# Patient Record
Sex: Female | Born: 1940 | Race: Black or African American | Hispanic: No | Smoking: Never smoker
Health system: Southern US, Community
[De-identification: ages and names within clinical notes are randomized; demographics above are authoritative.]

## PROBLEM LIST (undated history)

## (undated) DIAGNOSIS — M1990 Unspecified osteoarthritis, unspecified site: Secondary | ICD-10-CM

## (undated) DIAGNOSIS — R000 Tachycardia, unspecified: Secondary | ICD-10-CM

## (undated) DIAGNOSIS — F250 Schizoaffective disorder, bipolar type: Secondary | ICD-10-CM

## (undated) DIAGNOSIS — K625 Hemorrhage of anus and rectum: Secondary | ICD-10-CM

## (undated) DIAGNOSIS — I10 Essential (primary) hypertension: Secondary | ICD-10-CM

## (undated) DIAGNOSIS — Z789 Other specified health status: Secondary | ICD-10-CM

## (undated) DIAGNOSIS — D649 Anemia, unspecified: Secondary | ICD-10-CM

## (undated) DIAGNOSIS — R0602 Shortness of breath: Secondary | ICD-10-CM

## (undated) DIAGNOSIS — F259 Schizoaffective disorder, unspecified: Secondary | ICD-10-CM

## (undated) DIAGNOSIS — M4802 Spinal stenosis, cervical region: Secondary | ICD-10-CM

---

## 1998-02-12 ENCOUNTER — Other Ambulatory Visit: Admission: RE | Admit: 1998-02-12 | Discharge: 1998-02-12 | Payer: Self-pay | Admitting: Family Medicine

## 1999-10-05 ENCOUNTER — Emergency Department (HOSPITAL_COMMUNITY): Admission: EM | Admit: 1999-10-05 | Discharge: 1999-10-05 | Payer: Self-pay | Admitting: *Deleted

## 2000-05-31 ENCOUNTER — Other Ambulatory Visit: Admission: RE | Admit: 2000-05-31 | Discharge: 2000-05-31 | Payer: Self-pay | Admitting: Family Medicine

## 2001-01-26 ENCOUNTER — Ambulatory Visit (HOSPITAL_COMMUNITY): Admission: RE | Admit: 2001-01-26 | Discharge: 2001-01-26 | Payer: Self-pay | Admitting: Family Medicine

## 2001-01-26 ENCOUNTER — Encounter: Payer: Self-pay | Admitting: Family Medicine

## 2001-08-23 ENCOUNTER — Other Ambulatory Visit: Admission: RE | Admit: 2001-08-23 | Discharge: 2001-08-23 | Payer: Self-pay | Admitting: Family Medicine

## 2003-01-26 ENCOUNTER — Other Ambulatory Visit: Admission: RE | Admit: 2003-01-26 | Discharge: 2003-01-26 | Payer: Self-pay | Admitting: Anesthesiology

## 2003-01-26 ENCOUNTER — Other Ambulatory Visit: Admission: RE | Admit: 2003-01-26 | Discharge: 2003-01-26 | Payer: Self-pay | Admitting: Family Medicine

## 2003-05-28 ENCOUNTER — Emergency Department (HOSPITAL_COMMUNITY): Admission: AD | Admit: 2003-05-28 | Discharge: 2003-05-28 | Payer: Self-pay | Admitting: Family Medicine

## 2005-02-11 ENCOUNTER — Ambulatory Visit: Payer: Self-pay | Admitting: Psychiatry

## 2005-02-11 ENCOUNTER — Encounter: Payer: Self-pay | Admitting: Emergency Medicine

## 2005-02-11 ENCOUNTER — Inpatient Hospital Stay (HOSPITAL_COMMUNITY): Admission: AD | Admit: 2005-02-11 | Discharge: 2005-02-19 | Payer: Self-pay | Admitting: Psychiatry

## 2005-12-05 ENCOUNTER — Emergency Department (HOSPITAL_COMMUNITY): Admission: EM | Admit: 2005-12-05 | Discharge: 2005-12-06 | Payer: Self-pay | Admitting: Emergency Medicine

## 2006-04-11 ENCOUNTER — Emergency Department (HOSPITAL_COMMUNITY): Admission: EM | Admit: 2006-04-11 | Discharge: 2006-04-11 | Payer: Self-pay | Admitting: Emergency Medicine

## 2006-05-20 ENCOUNTER — Ambulatory Visit: Payer: Self-pay | Admitting: Obstetrics and Gynecology

## 2006-05-25 ENCOUNTER — Ambulatory Visit (HOSPITAL_COMMUNITY): Admission: RE | Admit: 2006-05-25 | Discharge: 2006-05-25 | Payer: Self-pay | Admitting: Obstetrics & Gynecology

## 2006-06-02 ENCOUNTER — Ambulatory Visit: Payer: Self-pay | Admitting: Obstetrics and Gynecology

## 2006-07-19 ENCOUNTER — Emergency Department (HOSPITAL_COMMUNITY): Admission: EM | Admit: 2006-07-19 | Discharge: 2006-07-19 | Payer: Self-pay | Admitting: Emergency Medicine

## 2007-01-27 ENCOUNTER — Emergency Department (HOSPITAL_COMMUNITY): Admission: EM | Admit: 2007-01-27 | Discharge: 2007-01-27 | Payer: Self-pay | Admitting: Emergency Medicine

## 2007-11-17 ENCOUNTER — Emergency Department (HOSPITAL_COMMUNITY): Admission: EM | Admit: 2007-11-17 | Discharge: 2007-11-17 | Payer: Self-pay | Admitting: Emergency Medicine

## 2008-05-15 ENCOUNTER — Encounter: Payer: Self-pay | Admitting: Cardiology

## 2008-05-30 ENCOUNTER — Emergency Department (HOSPITAL_COMMUNITY): Admission: EM | Admit: 2008-05-30 | Discharge: 2008-05-30 | Payer: Self-pay | Admitting: Emergency Medicine

## 2008-10-19 ENCOUNTER — Encounter: Payer: Self-pay | Admitting: Cardiology

## 2008-11-02 DIAGNOSIS — R519 Headache, unspecified: Secondary | ICD-10-CM | POA: Insufficient documentation

## 2008-11-02 DIAGNOSIS — R0602 Shortness of breath: Secondary | ICD-10-CM | POA: Insufficient documentation

## 2008-11-02 DIAGNOSIS — F209 Schizophrenia, unspecified: Secondary | ICD-10-CM

## 2008-11-02 DIAGNOSIS — R51 Headache: Secondary | ICD-10-CM

## 2008-11-02 DIAGNOSIS — R42 Dizziness and giddiness: Secondary | ICD-10-CM | POA: Insufficient documentation

## 2008-11-06 ENCOUNTER — Ambulatory Visit: Payer: Self-pay | Admitting: Cardiology

## 2008-11-06 ENCOUNTER — Observation Stay (HOSPITAL_COMMUNITY): Admission: EM | Admit: 2008-11-06 | Discharge: 2008-11-07 | Payer: Self-pay | Admitting: Emergency Medicine

## 2008-11-06 DIAGNOSIS — R000 Tachycardia, unspecified: Secondary | ICD-10-CM | POA: Insufficient documentation

## 2008-11-07 ENCOUNTER — Encounter: Payer: Self-pay | Admitting: Cardiology

## 2009-01-30 ENCOUNTER — Encounter (INDEPENDENT_AMBULATORY_CARE_PROVIDER_SITE_OTHER): Payer: Self-pay | Admitting: *Deleted

## 2009-01-31 ENCOUNTER — Emergency Department (HOSPITAL_COMMUNITY): Admission: EM | Admit: 2009-01-31 | Discharge: 2009-01-31 | Payer: Self-pay | Admitting: Emergency Medicine

## 2009-03-14 ENCOUNTER — Ambulatory Visit: Payer: Self-pay | Admitting: Cardiology

## 2009-03-14 DIAGNOSIS — I10 Essential (primary) hypertension: Secondary | ICD-10-CM

## 2009-03-21 ENCOUNTER — Telehealth (INDEPENDENT_AMBULATORY_CARE_PROVIDER_SITE_OTHER): Payer: Self-pay | Admitting: *Deleted

## 2009-03-25 ENCOUNTER — Ambulatory Visit: Payer: Self-pay

## 2009-03-25 ENCOUNTER — Ambulatory Visit: Payer: Self-pay | Admitting: Cardiology

## 2009-03-25 ENCOUNTER — Encounter: Payer: Self-pay | Admitting: Internal Medicine

## 2009-04-03 ENCOUNTER — Ambulatory Visit (HOSPITAL_COMMUNITY): Admission: RE | Admit: 2009-04-03 | Discharge: 2009-04-03 | Payer: Self-pay | Admitting: Oral Surgery

## 2009-04-03 ENCOUNTER — Telehealth (INDEPENDENT_AMBULATORY_CARE_PROVIDER_SITE_OTHER): Payer: Self-pay | Admitting: *Deleted

## 2009-04-10 LAB — CONVERTED CEMR LAB
HDL: 36.8 mg/dL — ABNORMAL LOW (ref >39.00)
TSH: 0.98 microintl units/mL (ref 0.35–5.50)
Triglycerides: 117 mg/dL (ref 0.0–149.0)
VLDL: 23.4 mg/dL (ref 0.0–40.0)

## 2009-04-23 ENCOUNTER — Ambulatory Visit: Payer: Self-pay | Admitting: Cardiology

## 2009-07-28 ENCOUNTER — Inpatient Hospital Stay (HOSPITAL_COMMUNITY): Admission: EM | Admit: 2009-07-28 | Discharge: 2009-08-05 | Payer: Self-pay | Admitting: Emergency Medicine

## 2009-07-29 ENCOUNTER — Ambulatory Visit: Payer: Self-pay | Admitting: Gastroenterology

## 2009-07-30 ENCOUNTER — Encounter (INDEPENDENT_AMBULATORY_CARE_PROVIDER_SITE_OTHER): Payer: Self-pay | Admitting: Internal Medicine

## 2009-07-30 ENCOUNTER — Encounter: Payer: Self-pay | Admitting: Gastroenterology

## 2009-08-01 ENCOUNTER — Encounter: Payer: Self-pay | Admitting: Gastroenterology

## 2009-08-02 ENCOUNTER — Encounter: Admission: RE | Admit: 2009-08-02 | Discharge: 2009-08-02 | Payer: Self-pay | Admitting: Obstetrics & Gynecology

## 2010-08-03 ENCOUNTER — Encounter: Payer: Self-pay | Admitting: Unknown Physician Specialty

## 2010-08-03 ENCOUNTER — Encounter: Payer: Self-pay | Admitting: Cardiology

## 2010-08-14 NOTE — Procedures (Signed)
Summary: Upper Endoscopy  Patient: [name] Note: All result statuses are Final unless otherwise noted.  Tests: (1) Upper Endoscopy (EGD)   EGD Upper Endoscopy       DONE     [facility]     [address] [place], [place]  [address]           ENDOSCOPY PROCEDURE REPORT           PATIENT:  [name], [name]  MR#:  [id number]     BIRTHDATE:  September 14, 1940, 68 yrs. old  GENDER:  female           ENDOSCOPIST:  Barbette Hair. Arlyce Dice, MD     Referred by:           PROCEDURE DATE:  07/30/2009     PROCEDURE:  EGD with biopsy     ASA CLASS:  Class II     INDICATIONS:  iron deficiency anemia           MEDICATIONS:   Fentanyl 25 mcg, Versed 2 mg, glycopyrrolate     (Robinal) 0.2 mg     TOPICAL ANESTHETIC:  Cetacaine Spray           DESCRIPTION OF PROCEDURE:   After the risks benefits and     alternatives of the procedure were thoroughly explained, informed     consent was obtained.  The  endoscope was introduced through the     mouth and advanced to the third portion of the duodenum, without     limitations.  The instrument was slowly withdrawn as the mucosa     was fully examined.     <<PROCEDUREIMAGES>>           A sessile polyp was found in the body of the stomach. 5mm     nonbleeding polyp (see image3). Bx taken  Otherwise the     examination was normal (see image1, image2, image4, and image6).     Retroflexed views revealed no abnormalities.    The scope was then     withdrawn from the patient and the procedure completed.           COMPLICATIONS:  None           ENDOSCOPIC IMPRESSION:     1) Sessile polyp in the body of the stomach     2) Otherwise normal examination     RECOMMENDATIONS:     1) await biopsy results     2)  colonoscopy when respiratory status is improved           REPEAT EXAM:  No           ______________________________     Barbette Hair. Arlyce Dice, MD           CC:  Billee Cashing MD           n.     Rosalie DoctorBarbette Hair. Quoc Tome at  07/30/2009 11:06 AM           Leamon Arnt, [id number]  Note: An exclamation mark (!) indicates a result that was not dispersed into the flowsheet. Document Creation Date: 07/30/2009 11:06 AM _______________________________________________________________________  (1) Order result status: Final Collection or observation date-time: 07/30/2009 11:01 Requested date-time:  Receipt date-time:  Reported date-time:  Referring Physician:   Ordering Physician: Melvia Heaps [phone]) Specimen Source:  Source: Launa Grill Order Number: [id number] Lab site:

## 2010-08-14 NOTE — Procedures (Signed)
Summary: Colonoscopy  Patient: [name] Note: All result statuses are Final unless otherwise noted.  Tests: (1) Colonoscopy (COL)   COL Colonoscopy           DONE     [facility]     [address] [place], [place]  [address]           COLONOSCOPY PROCEDURE REPORT           PATIENT:  [name], [name]  MR#:  [id number]     BIRTHDATE:  02-13-1941, 68 yrs. old  GENDER:  female           ENDOSCOPIST:  Barbette Hair. Arlyce Dice, MD     Referred by:           PROCEDURE DATE:  08/01/2009     PROCEDURE:  Colonoscopy, Diagnostic     ASA CLASS:  Class II     INDICATIONS:  iron deficiency anemia           MEDICATIONS:   Fentanyl 50 mcg, Versed 4 mg           DESCRIPTION OF PROCEDURE:   After the risks benefits and     alternatives of the procedure were thoroughly explained, informed     consent was obtained.  Digital rectal exam was performed and     revealed no abnormalities.   The [id number] ([id number]) endoscope was     introduced through the anus and advanced to the cecum, which was     identified by both the appendix and ileocecal valve, without     limitations.  The quality of the prep was excellent, using     MiraLax.  The instrument was then slowly withdrawn as the colon     was fully examined.     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 Scattered diverticula from     sigmoid to ascending colon. No fresh or old blood (see image009     and image008).  This was otherwise a normal examination of the     colon (see image001, image002, image003, image005, image006,     image007, image010, image011, and image012).   Retroflexed views     in the rectum revealed no abnormalities.    The scope was then     withdrawn from the patient and the procedure completed.           COMPLICATIONS:  None           ENDOSCOPIC IMPRESSION:     1) Diverticula     2) Otherwise normal examination     RECOMMENDATIONS:     1) followup hemeoccults           REPEAT  EXAM:  No           ______________________________     Barbette Hair. Arlyce Dice, MD           CC:  Billee Cashing MD           n.     Rosalie DoctorBarbette Hair. Hannah Crill at 08/01/2009 04:08 PM           Leamon Arnt, [id number]  Note: An exclamation mark (!) indicates a result that was not dispersed into the flowsheet. Document Creation Date: 08/01/2009 4:08 PM _______________________________________________________________________  (1) Order result status: Final Collection or observation date-time: 08/01/2009 16:01 Requested date-time:  Receipt date-time:  Reported date-time:  Referring Physician:  Ordering Physician: Melvia Heaps [phone]) Specimen Source:  Source: Launa Grill Order Number: [id number] Lab site:

## 2010-08-21 ENCOUNTER — Other Ambulatory Visit: Payer: Self-pay | Admitting: Family Medicine

## 2010-08-21 DIAGNOSIS — Z1231 Encounter for screening mammogram for malignant neoplasm of breast: Secondary | ICD-10-CM

## 2010-08-25 ENCOUNTER — Ambulatory Visit: Payer: Self-pay

## 2010-09-28 LAB — CK TOTAL AND CKMB (NOT AT ARMC)
CK, MB: 3.2 ng/mL (ref 0.3–4.0)
CK, MB: 4.5 ng/mL — ABNORMAL HIGH (ref 0.3–4.0)
Relative Index: 0.5 (ref 0.0–2.5)
Relative Index: 0.5 (ref 0.0–2.5)
Relative Index: 0.6 (ref 0.0–2.5)
Relative Index: 0.8 (ref 0.0–2.5)
Total CK: 1074 U/L — ABNORMAL HIGH (ref 7–177)
Total CK: 423 U/L — ABNORMAL HIGH (ref 7–177)
Total CK: 947 U/L — ABNORMAL HIGH (ref 7–177)

## 2010-09-28 LAB — CBC
HCT: 25.5 % — ABNORMAL LOW (ref 36.0–46.0)
HCT: 25.6 % — ABNORMAL LOW (ref 36.0–46.0)
MCHC: 31.1 g/dL (ref 30.0–36.0)
MCV: 64.7 fL — ABNORMAL LOW (ref 78.0–100.0)
Platelets: 237 10*3/uL (ref 150–400)
Platelets: 267 10*3/uL (ref 150–400)
Platelets: 269 10*3/uL (ref 150–400)
RBC: 4.3 MIL/uL (ref 3.87–5.11)
RDW: 20.7 % — ABNORMAL HIGH (ref 11.5–15.5)
RDW: 20.9 % — ABNORMAL HIGH (ref 11.5–15.5)
WBC: 11.3 10*3/uL — ABNORMAL HIGH (ref 4.0–10.5)
WBC: 5.6 10*3/uL (ref 4.0–10.5)
WBC: 8.9 10*3/uL (ref 4.0–10.5)
WBC: 9.1 10*3/uL (ref 4.0–10.5)

## 2010-09-28 LAB — COMPREHENSIVE METABOLIC PANEL
ALT: 18 U/L (ref 0–35)
AST: 22 U/L (ref 0–37)
Alkaline Phosphatase: 106 U/L (ref 39–117)
CO2: 22 mEq/L (ref 19–32)
Chloride: 101 mEq/L (ref 96–112)
GFR calc Af Amer: >60 mL/min (ref >60)
GFR calc non Af Amer: 58 mL/min — ABNORMAL LOW (ref >60)
Sodium: 134 mEq/L — ABNORMAL LOW (ref 135–145)
Total Bilirubin: 0.4 mg/dL (ref 0.3–1.2)

## 2010-09-28 LAB — RETICULOCYTES
RBC.: 3.9 MIL/uL (ref 3.87–5.11)
Retic Count, Absolute: 46.8 10*3/uL (ref 19.0–186.0)
Retic Ct Pct: 1.2 % (ref 0.4–3.1)

## 2010-09-28 LAB — TYPE AND SCREEN
ABO/RH(D): A POS
Antibody Screen: NEGATIVE

## 2010-09-28 LAB — BASIC METABOLIC PANEL
BUN: 4 mg/dL — ABNORMAL LOW (ref 6–23)
BUN: 6 mg/dL (ref 6–23)
BUN: 7 mg/dL (ref 6–23)
BUN: 9 mg/dL (ref 6–23)
CO2: 22 mEq/L (ref 19–32)
CO2: 24 mEq/L (ref 19–32)
CO2: 24 mEq/L (ref 19–32)
Calcium: 8.4 mg/dL (ref 8.4–10.5)
Calcium: 8.5 mg/dL (ref 8.4–10.5)
Calcium: 8.6 mg/dL (ref 8.4–10.5)
Chloride: 107 mEq/L (ref 96–112)
Chloride: 108 mEq/L (ref 96–112)
Creatinine, Ser: 0.69 mg/dL (ref 0.4–1.2)
Creatinine, Ser: 0.76 mg/dL (ref 0.4–1.2)
Creatinine, Ser: 0.84 mg/dL (ref 0.4–1.2)
GFR calc Af Amer: >60 mL/min (ref >60)
GFR calc Af Amer: >60 mL/min (ref >60)
GFR calc non Af Amer: >60 mL/min (ref >60)
GFR calc non Af Amer: >60 mL/min (ref >60)
GFR calc non Af Amer: >60 mL/min (ref >60)
Glucose, Bld: 157 mg/dL — ABNORMAL HIGH (ref 70–99)
Potassium: 4.1 mEq/L (ref 3.5–5.1)
Potassium: 4.2 mEq/L (ref 3.5–5.1)
Potassium: 4.3 mEq/L (ref 3.5–5.1)
Sodium: 137 mEq/L (ref 135–145)

## 2010-09-28 LAB — DIFFERENTIAL
Basophils Absolute: 0 10*3/uL (ref 0.0–0.1)
Eosinophils Absolute: 0.1 10*3/uL (ref 0.0–0.7)
Lymphs Abs: 0.6 10*3/uL — ABNORMAL LOW (ref 0.7–4.0)
Neutro Abs: 8 10*3/uL — ABNORMAL HIGH (ref 1.7–7.7)

## 2010-09-28 LAB — IRON AND TIBC: Iron: 11 ug/dL — ABNORMAL LOW (ref 42–135)

## 2010-09-28 LAB — CULTURE, BLOOD (ROUTINE X 2)
Culture: NO GROWTH
Culture: NO GROWTH

## 2010-09-28 LAB — URINALYSIS, ROUTINE W REFLEX MICROSCOPIC
Bilirubin Urine: NEGATIVE
Glucose, UA: NEGATIVE mg/dL
Ketones, ur: NEGATIVE mg/dL
Nitrite: NEGATIVE
Protein, ur: NEGATIVE mg/dL
pH: 6.5 (ref 5.0–8.0)

## 2010-09-28 LAB — HEMOCCULT GUIAC POC 1CARD (OFFICE): Fecal Occult Bld: NEGATIVE

## 2010-09-28 LAB — CANCER ANTIGEN 19-9: CA 19-9: 5.8 U/mL — ABNORMAL LOW (ref <35.0)

## 2010-09-28 LAB — VITAMIN B12: Vitamin B-12: 632 pg/mL (ref 211–911)

## 2010-09-28 LAB — CEA: CEA: 2 ng/mL (ref 0.0–5.0)

## 2010-09-28 LAB — APTT: aPTT: 29 seconds (ref 24–37)

## 2010-09-28 LAB — CK
Total CK: 301 U/L — ABNORMAL HIGH (ref 7–177)
Total CK: 755 U/L — ABNORMAL HIGH (ref 7–177)

## 2010-09-28 LAB — LACTIC ACID, PLASMA: Lactic Acid, Venous: 1.3 mmol/L (ref 0.5–2.2)

## 2010-09-28 LAB — TROPONIN I
Troponin I: 0.01 ng/mL (ref 0.00–0.06)
Troponin I: 0.02 ng/mL (ref 0.00–0.06)

## 2010-09-28 LAB — D-DIMER, QUANTITATIVE: D-Dimer, Quant: 0.82 ug/mL-FEU — ABNORMAL HIGH (ref 0.00–0.48)

## 2010-09-28 LAB — PREPARE RBC (CROSSMATCH)

## 2010-09-28 LAB — URINE CULTURE
Colony Count: NO GROWTH
Culture: NO GROWTH

## 2010-09-28 LAB — ABO/RH: ABO/RH(D): A POS

## 2010-09-28 LAB — HEMOGLOBIN AND HEMATOCRIT, BLOOD: Hemoglobin: 8.3 g/dL — ABNORMAL LOW (ref 12.0–15.0)

## 2010-10-17 LAB — BASIC METABOLIC PANEL
BUN: 12 mg/dL (ref 6–23)
Chloride: 100 mEq/L (ref 96–112)
GFR calc Af Amer: >60 mL/min (ref >60)
Potassium: 4.1 mEq/L (ref 3.5–5.1)

## 2010-10-17 LAB — GLUCOSE, CAPILLARY: Glucose-Capillary: 162 mg/dL — ABNORMAL HIGH (ref 70–99)

## 2010-10-17 LAB — CBC
HCT: 31 % — ABNORMAL LOW (ref 36.0–46.0)
MCV: 64.2 fL — ABNORMAL LOW (ref 78.0–100.0)
RBC: 4.83 MIL/uL (ref 3.87–5.11)
WBC: 7.4 10*3/uL (ref 4.0–10.5)

## 2010-10-22 LAB — DIFFERENTIAL
Eosinophils Relative: 2 % (ref 0–5)
Monocytes Absolute: 0.5 10*3/uL (ref 0.1–1.0)
Monocytes Relative: 7 % (ref 3–12)
Neutrophils Relative %: 69 % (ref 43–77)

## 2010-10-22 LAB — CBC
MCHC: 31.4 g/dL (ref 30.0–36.0)
RBC: 4.85 MIL/uL (ref 3.87–5.11)
WBC: 6.6 10*3/uL (ref 4.0–10.5)

## 2010-10-22 LAB — BASIC METABOLIC PANEL
Calcium: 9 mg/dL (ref 8.4–10.5)
Creatinine, Ser: 0.95 mg/dL (ref 0.4–1.2)
GFR calc Af Amer: >60 mL/min (ref >60)

## 2010-10-22 LAB — D-DIMER, QUANTITATIVE: D-Dimer, Quant: 1.07 ug/mL-FEU — ABNORMAL HIGH (ref 0.00–0.48)

## 2010-10-22 LAB — BRAIN NATRIURETIC PEPTIDE: Pro B Natriuretic peptide (BNP): <30 pg/mL (ref 0.0–100.0)

## 2010-10-22 LAB — LIPID PANEL: Triglycerides: 135 mg/dL (ref <150)

## 2010-11-06 ENCOUNTER — Emergency Department (HOSPITAL_COMMUNITY)
Admission: EM | Admit: 2010-11-06 | Discharge: 2010-11-06 | Disposition: A | Discharge status: Home / Self Care | Payer: Medicare Other | Attending: Emergency Medicine | Admitting: Emergency Medicine

## 2010-11-06 ENCOUNTER — Emergency Department (HOSPITAL_COMMUNITY): Payer: Medicare Other

## 2010-11-06 DIAGNOSIS — K219 Gastro-esophageal reflux disease without esophagitis: Secondary | ICD-10-CM | POA: Insufficient documentation

## 2010-11-06 DIAGNOSIS — M25559 Pain in unspecified hip: Secondary | ICD-10-CM | POA: Insufficient documentation

## 2010-11-06 DIAGNOSIS — M169 Osteoarthritis of hip, unspecified: Secondary | ICD-10-CM | POA: Insufficient documentation

## 2010-11-06 DIAGNOSIS — I10 Essential (primary) hypertension: Secondary | ICD-10-CM | POA: Insufficient documentation

## 2010-11-06 DIAGNOSIS — E669 Obesity, unspecified: Secondary | ICD-10-CM | POA: Insufficient documentation

## 2010-11-06 DIAGNOSIS — J45909 Unspecified asthma, uncomplicated: Secondary | ICD-10-CM | POA: Insufficient documentation

## 2010-11-06 DIAGNOSIS — M1610 Unilateral primary osteoarthritis, unspecified hip: Secondary | ICD-10-CM | POA: Insufficient documentation

## 2010-11-25 NOTE — Discharge Summary (Signed)
[name], [name]             ACCOUNT NO.:  [id number]   MEDICAL RECORD NO.:  [id number]          PATIENT TYPE:  OBV   LOCATION:  3733                         FACILITY:  [facility]   PHYSICIAN:  [name], MDDATE OF BIRTH:  [birth year]   DATE OF ADMISSION:  11/06/2008  DATE OF DISCHARGE:  11/07/2008                               DISCHARGE SUMMARY   CARDIOLOGIST:  Dr. Swan Macdonald.   PRIMARY CARE [name]:  Dr. Loleta Macdonald.   DISCHARGE DIAGNOSES:  Noncardiac shortness of breath and chest pain.   SECONDARY DIAGNOSES:  1. Hypertension.  2. Obesity.  3. Asthma.  4. History of schizophrenia.  5. History of rectal bleeding.  6. History of cervical stenosis.  7. History of dizziness.  8. Mild dementia.  9. Gastroesophageal reflux disease.  10.Chronic anemia.   ALLERGIES:  NKDA.   PROCEDURES PERFORMED DURING THIS HOSPITALIZATION:  1. CT angio of the chest November 06, 2008.  Negative for pulmonary      embolism.  DJD about the right shoulder.  Visualized lower cervical      and mid thoracic spine.  2. Chest x-ray on November 06, 2008 showing no acute disease.  3. EKG November 06, 2008 shows sinus tachycardia at a rate of 122 beats      per minute.  Some Q wave flattening in V6.  No significant Q wave.      Normal axis.  No evidence of hypertrophy.  PR interval 168, QRS 76,      QTc 456.  No prior tracing for comparison.  4. A 2D echocardiogram performed November 07, 2008 showing mild focal      basal hypertrophy of the septum, estimated EF of 75%.  Wall motion      was normal.  No regional abnormalities.  Normal study.   BRIEF HISTORY OF PRESENT ILLNESS:  [name] is a 70 year old female  with a history of schizophrenia, hypertension, obesity, and asthma  presenting for evaluation of new dyspnea on exertion.  There is no  family with her today and her nursing assistant from the assisted living  facility does not know her well, so all history comes from the patient  (which is difficult to obtain).  She has a long history of shortness of  breath with exertion.  However, her symptoms have been worse over the  past 2 weeks.  This includes shortness of breath with minimal exertion  with walking down the halls at her assisted living facility.  She also  gets short of breath while dressing herself.  She denies PND and/or  orthopnea.  She states that she sometimes wheezes, but is not wheezing  today.  She is tachycardic during the exam, up to 131 beats per minute.  Last heart rate earlier in the month was 90 beats per minute  (documentation from assisted living).  Blood pressure is 100/60.  The  patient also reports some light-headedness with standing.  She denies  chest pain/tightness.  She does report some mild weight gain and  swelling in her feet, unsure how long this has been present.   HOSPITAL COURSE:  The  patient admitted and underwent procedures as  described above.  She tolerated them well without significant  complications.  Symptoms improved on November 07, 2008 and as no objective  evidence of any ominous etiology to the patient's new symptoms, the  patient discharged back to assisted living facility to follow up with  her primary care physician.  At this time, no further cardiac workup is  deemed necessary.  The patient will be discharged with her old  medication list.   FOLLOWUP INSTRUCTIONS:  This discharge summary will be made stat so as  to be made available to the assisted living facility.   VITAL SIGNS ON DATE OF DISCHARGE:  Temperature 97.8 degrees Fahrenheit,  BP 117/66, pulse 91, respirations 18, O2 saturation 96% on room air,  weight 130.5 kg.   DISCHARGE LABS:  White blood cell count 6.6, hemoglobin 9.8, hematocrit  31.2, platelet count 286,000, RDW 21.8.  The patient had normal  differential.  MCV 64.3, MCHC 31.5.  D-dimer 1.07.  Negative CT angio.  Sodium 133, potassium 4.1, chloride 104, CO2 20, BUN 9, creatinine 0.95,  glucose  150, calcium 9.0, BNP less than 30, total cholesterol 124,  triglyceride 135, HDL 31, LDL 66.   FOLLOWUP PLANS AND APPOINTMENTS:  Please see hospital course.   DISCHARGE MEDICATIONS:  1. Aricept 10 mg p.o. nightly.  2. Lisinopril 10 mg p.o. daily.  3. Risperidone 4 mg 1/2 tab p.o. t.i.d.  4. Topiramate 100 mg 1/2 p.o. t.i.d.  5. Cogentin 1 mg per mL solution p.o. b.i.d.  6. Amlodipine 5 mg p.o. daily.  7. Benztropine 1 mg p.o. daily.  8. Folic acid 1 mg p.o. daily.  9. Benadryl 25 mg p.o. daily.  10.Omeprazole 20 mg p.o. daily.  11.Docusate sodium stool softener 100 mg p.o. nightly.  12.Hydrocortisone cream applied t.i.d.  13.Miralax 17 g p.o. daily.  14.Citrucel plus bone density 1 tab p.o. b.i.d.  15.Aspirin 81 mg p.o. daily.   DURATION OF DISCHARGE ENCOUNTER:  Including physician time, is 45  minutes.      [name], [facility]      Mariah Carrow, MD  Electronically Signed    MS/MEDQ  D:  11/07/2008  T:  11/07/2008  Job:  [id number]   cc:   Mariah Ancona, MD  Dr. Loleta Macdonald  [name]. Mariah Pates, MD, [facility]

## 2010-11-28 NOTE — H&P (Signed)
[name], [name]             ACCOUNT NO.:  [id number]   MEDICAL RECORD NO.:  [id number]          PATIENT TYPE:  IPS   LOCATION:  0402                          FACILITY:  [facility]   PHYSICIAN:  Geoffery Lyons, M.D.      DATE OF BIRTH:  Dec 02, 1940   DATE OF ADMISSION:  02/11/2005  DATE OF DISCHARGE:                         PSYCHIATRIC ADMISSION ASSESSMENT   IDENTIFYING INFORMATION:  This is a 70 year old African-American female who  is single.  This is an involuntary.   HISTORY OF PRESENT ILLNESS:  This 70 year old African-American female who is  followed by the local mental health agency was involuntarily petitioned  after police arrived at her apartment.  She had been running in the streets  naked.  When she was restrained and taken into her apartment to get dressed,  she attempted to jump out of a window.  She had a lot of pressured speech,  yelling at the officers, what was able to be redirected.  Today, she  continues to be disorganized in thoughts, somewhat irritable with labile  mood.  Endorses the fact that she is able to carry on some conversation.  She says that she has missed her oral Prolixin doses to some extent this  week.  Admits to taking them irregularly and her family has reported to our  Child psychotherapist that she has missed her most recent Prolixin injection.  She  admits to feeling anxious and stressed over concerns about moving to what  sounds like an assisted living facility and has been working with the Conserv[employer], museum/gallery.  She denies feeling suicidal or homicidal.  She has a poor  memory of yesterday's events and believes today that she is in an assisted-  living facility.   PAST PSYCHIATRIC HISTORY:  This is the patient's first admission to [facility].  There is some question on if she has been  here before but, at this point, I am unable to locate a prior record.  She  reports being diagnosed in her teen years with mental illness and  was  started on Prolixin while she was still in her teens when she lived in  [place], [place].  She denies any prior suicide attempts.  Denies substance abuse.   SOCIAL HISTORY:  This is a single African-American female, born and raised  in [place], [place].  Does have supportive family that lives  nearby.  Currently living in [place], [place]. She lives  alone.  Reports that she has been working with her Child psychotherapist, Ms.  Fredric Mare, her Child psychotherapist at [employer] on placement in  an extended living facility.  She has Medicaid to help with her healthcare  and medications and obtains her medications at [facility].  She denies  any current legal problems.   FAMILY HISTORY:  Not available.   ALCOHOL/DRUG HISTORY:  She denies substance abuse.   PRIMARY CARE PHYSICIAN:  She reports her primary care physician is Dr.  Madaline Savage.   MEDICAL PROBLEMS:  Osteoarthritis and high blood pressure.  Past medical  history is remarkable  for osteoarthritis controlled with ketoprofen, some  elevated blood pressure.  She has a history of poor dental care.  Reports a  history of anemia and some history of having hot flashes for which she is  prescribed Premarin.  Under current medical problems, please note the  patient has some chronic lumbar back pain but denies that it is a problem  today.  It is relieved by her ketoprofen, which she takes.   REVIEW OF SYSTEMS:  Today is remarkable for some recent clear rhinorrhea  with some frontal headache.  Not a problem today.  She denies any dysuria.  Denies chest pain or shortness of breath or postnocturnal dyspnea.   CURRENT MEDICATIONS:  Ketoprofen for arthritis (dose unknown), Premarin 1.25  mg p.o. daily, Prolixin 5 mg p.o. b.i.d., Prolixin injection one time  monthly at mental health (dose unknown), Benadryl 25 mg q.h.s., Cogentin 1  mg b.i.d., lisinopril 10 mg daily, quinine sulfate 325  mg, 1 tab daily.   ALLERGIES:  None.   POSITIVE PHYSICAL FINDINGS:  GENERAL:  This is a well-nourished, well-  developed but obese African-American female with short stature.  Today, she  is disheveled.  Hygiene is quite poor with strong body odor.  VITAL SIGNS:  Height 5 feet 2 inches tall, weight 202 pounds.  She is  afebrile, pulse 88, respirations 16, blood pressure 169/80.  Pulse oximetry  at 100%.  She does not use tobacco.  HEAD:  Normocephalic and atraumatic.  EENT:  Extraocular movements are within normal limits.  No nystagmus.  Sclera are nonicteric.  No rhinorrhea.  Nasal passages are clear.  Stable to  inhale easily through her nose.  Many teeth missing with poor dental hygiene  and mouth odor and she has some oral buccal movements and some involuntary  tongue movement.  NECK:  Supple.  No thyromegaly.  Full active range of motion without  distress.  CHEST:  Clear to auscultation.  BREAST:  Exam deferred.  CARDIOVASCULAR:  S1 and S2 are heard.  No clicks, murmurs or gallops.  Normal EKG in the emergency room.  ABDOMEN:  Rounded, soft, nontender, nondistended.  Bowel sounds within  normal limits.  GENITOURINARY:  Deferred.  EXTREMITIES:  No edema, clubbing or cyanosis.  SKIN:  Intact.  No rashes.  No signs of self-mutilation.  NEUROLOGIC:  Motor and facial symmetry is present.  We have not had her up  to test her gait or cerebellar function yet.  Unable to do a Romberg at this  time because she is somewhat resistant to a full exam.  Will recheck that  later but at this point, neurologic is nonfocal.   LABORATORY DATA:  WBC 9.3, hemoglobin 13.6, hematocrit 40, platelets  366,000.  Sodium 139, potassium 3.4, chloride 106, BUN 13, glucose 108.  This is a random specimen.  Urine drug screen was negative for all  substances.  Alcohol level was less than 5.  Urinalysis within normal limits  with a specific gravity of 1.008.  MENTAL STATUS EXAM:  This is a fully alert  female.  Pleasant with a  perplexed look.  Her mood and affect are labile at this point.  She is  cooperative and directible.  However, in the course of the interview, her  daughter calls to talk to her.  She becomes quite agitated with pressured  and rapid speech.  Mood is anxious, irritable and labile.  Insight and  judgment are impaired.  Thoughts are disorganized, impulsive.  She appears  to be internally distracted.  Oriented to person and to day.  Is correct on  the day.  Confused about where she is.  Believing that she is in a [facility] extended living home where her social worker promised her she was  going to be placed.  No evidence of suicidal or homicidal thoughts.  She is  primarily preoccupied with getting her extended living facility.   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  AXIS II:  Deferred.  AXIS III:  Hypertension, rule out neuroleptic-induced dyskinesia,  osteoarthritis.  AXIS IV:  Deferred.  We are going to rule out her ability to live  independently.  Having a supportive Child psychotherapist, Ms. Fredric Mare, is apparently  a big asset for her.  Family is supportive and this is an asset to her.  AXIS V:  Current 20-25; past year 40.   PLAN:  Involuntarily admit the patient to stabilize her mood and improve her  reality testing and calm her mood.  We are going to ask the RN to validate  her medications with [facility] this morning. Will coordinate things with  mental health this morning and get a better idea of her diagnosis and her  current medications including her IM Prolixin dose.  Meanwhile, we are going  to restart her Prolixin at 5 mg b.i.d. as previously prescribed and 5 mg  q.6h. p.r.n. for agitation.  She also has Ativan 0.5 mg q.6h. p.r.n. for  agitation ordered.  Will force fluids.  Get a basic metabolic panel on her  along with a liver profile and a TSH.  Will continue her benztropine 1 mg  p.o. b.i.d. and diphenhydramine 25 mg  q.h.s. and lisinopril 10 mg daily.   ESTIMATED LENGTH OF STAY:  Five to six days.       MAS/MEDQ  D:  02/12/2005  T:  02/12/2005  Job:  [id number]

## 2010-11-28 NOTE — Group Therapy Note (Signed)
[name], [name]             ACCOUNT NO.:  [id number]   MEDICAL RECORD NO.:  [id number]          PATIENT TYPE:  WOC   LOCATION:  [facility]                   FACILITY:  [facility]   PHYSICIAN:  Argentina Donovan, MD        DATE OF BIRTH:  10/20/1940   DATE OF SERVICE:                                    CLINIC NOTE   Patient is here for a consult referral from Dr. Marzetta Board office for an  abnormal pelvic ultrasound.   HISTORY OF PRESENT ILLNESS:  [name] is a 70 year old African-  American female gravida 4, para 4, 0-0-4 all were C-sections,  with past  medical history significant for __________ gravis, schizophrenia and  hypertension who presents today with an abnormal pelvic ultrasound.  Patient  was referred to Dr. Marzetta Board office after being __________ patient for  abnormal bleeding.  The patient states she has had abnormal bleeding for  approximately 10 years now.  She has a hemoglobin of 11.9.  Patient was sent  over for referral due to an ultrasound that showed a 3.1 cm endometrial  stripe, a very small uterus of 8.3 x 6.7 x 6.8 with 2 fibroids at 32.5 cm  anterior fundus and a 2.4 cm fibroid.  Also the patient had a 4.58 cm left  ovarian mass which is suggestive of a dermoid cyst as well as a right simple  cyst on the right ovary.  Patient has been on adipose estrogen therapy for  an unknown idea of time, uncertain etiology of who prescribed this for her  and why she has been on it.  Patient had a negative Pap smear performed on  April 23, 2006.  Patient was referred today for endometrial biopsy.  Patient was brought having no current complaints.  Patient was placed in  ostotomy position.  Cervix was identified.  Speculum was advanced.  Tenaculum was placed.  Endometrial __________  was unable to be inserted  into the cervix due to cervical stenosis.  Speculum was removed __________ a  very small uterus, unable to palpate her ovaries.   ASSESSMENT AND PLAN:  This is a  70 year old with abnormal vaginal bleeding  for several months now.  We will go ahead and refer her for a CA125, a BMET  and a CT of the abdomen and pelvis to evaluate for a ovarian mass as well as  her endometrial stripe.  We will go ahead and see this patient back in  approximately 2 weeks to further evaluate.  Dr. Okey Dupre was present during this  entire examination and agrees with the plan as follows.     ______________________________  Dillard Essex    ______________________________  Argentina Donovan, MD    /MEDQ  D:  05/20/2006  T:  05/21/2006  Job:  [id number]

## 2010-11-28 NOTE — Group Therapy Note (Signed)
NAME:  [name], [name] NO.:  [id number]   MEDICAL RECORD NO.:  [id number]          PATIENT TYPE:  WOC   LOCATION:  [facility]                   FACILITY:  [facility]   PHYSICIAN:  Argentina Donovan, MD        DATE OF BIRTH:  15-Jul-1940   DATE OF SERVICE:                                    CLINIC NOTE   The patient is a 70 year old, gravida 4, para 4-0-0-4 with 4 cesarean  sections who had significant medical history for schizophrenia,  hypertension, and was seen in Emergency Room and is seen by Dr. Arlyce Dice and  then sent to [place] for postmenopausal bleeding.  Arriving here, we found out  that she had unopposed estrogen, some leiomyomata, and a dermoid cyst of 4  cm in one of her ovaries.  CA125 was normal.  We attempted endometrial  biopsy because the cervical stenosis was unable to be done.  CT scan was  done and confirmed the findings also which showed some evidence of fatty  infiltration of the liver and mild diverticulosis.  We are going to admit  the patient for [facility] hysteroscopy for the postmenopausal bleeding.  However,  it seems to have stopped since we insisted she stop the unopposed estrogen.  Why she was on this we do not have any idea and cannot gain from her  previous records.  She is on multiple medications among them albuterol,  Aricept, Benadryl, Combivent, folic acid, lisinopril, Motrin, Risperdal,  Topamax, and Zestril.   IMPRESSION:  Postmenopausal bleeding, small dermoid cyst.           ______________________________  Argentina Donovan, MD     PR/MEDQ  D:  06/02/2006  T:  06/02/2006  Job:  [id number]

## 2010-11-28 NOTE — Consult Note (Signed)
NAMEBALBINA, [name]             ACCOUNT NO.:  [id number]   MEDICAL RECORD NO.:  [id number]          PATIENT TYPE:  EMS   LOCATION:  MAJO                         FACILITY:  [facility]   PHYSICIAN:  Jonna L. Robb Matar, M.D.DATE OF BIRTH:  09/19/1940   DATE OF CONSULTATION:  DATE OF DISCHARGE:  02/10/2005                                   CONSULTATION   REFERRING PHYSICIAN:  ___________   REASON FOR CONSULTATION:  Medical clearance for Behavior Medicine.   HISTORY OF PRESENT ILLNESS:  This 70 year old African-American female with  an extensive history of bipolar disease and psychosis was found wandering in  the streets at 4 a.m. completely naked.  The patient is on a number of  medications but according to her sister she gets 30 days worth but by about  the fifteenth day she has taken all of them and then frequently runs out or  does not get them renewed.  She is also often times reluctant to have  someone else give her her medications and refuses them.   ALLERGIES:  None.   CURRENT MEDICATIONS:  Ketoprofen p.r.n.  The patient's medications are  listed as Prolixin, fluphenazine, Ultram, Benadryl.  Cogentin.  Augmentin.   PAST MEDICAL HISTORY:  The patient says she has had high blood pressure but  does not think she is taking anything for it now because it got better.   OPERATIONS:  None.   FAMILY HISTORY:  Nervous problems and her mother had a goiter.   REVIEW OF SYSTEMS:  The patient denies heart disease, breathing problems or  lung disease.  Denies any problems with her breast.  She has had four  children and no difficulty with her pregnancy.  She denies hematuria, nausea  or vomiting, hematemesis.  She does have arthritis in her back.  The other  review of systems were negative.   PHYSICAL EXAMINATION:  VITAL SIGNS:  Blood pressure was initially 185/92 and  is now down to 121/68, pulse 112 and is now down to 90, temperature 100.1  and is now down to 98.8, 97% oxygen on room  air.  GENERAL:  Morbidly overweight African-American female who is extremely  talkative and slightly pressured speech.  Conjunctivae and lids are normal.  Pupils are reactive.  Extraocular movements are full.  She has no  exophthalmus.  Normal hearing, mucosa and pharynx.  NECK:  No masses, thyromegaly or carotid bruits.  LUNGS:  Respiratory effort is normal.  Lungs are clear to auscultation and  percussion without wheezing, rales, rhonchi or dullness.  HEART:  She had a slight tachycardic rhythm when first examined.  Normal S1  and S2 without murmurs, rubs, or gallops.  There is no bruits, cyanosis,  clubbing or edema.  BREASTS:  Large without mass, tenderness or discharge.  ABDOMEN:  Nontender, normal bowel sounds, no hepatosplenomegaly.  BACK:  She had no cervical adenopathy.  Muscle strength there is 5/5 with  full range of motion in all four extremities.  SKIN:  No rashes, lesions, nodules or induration.  NEUROLOGIC:  Cranial nerves are intact.  DTR's are 3+.  Sensation is normal.  She has some slight cogwheeling.  No tremor.  The patient is alert.  She is  oriented x3.  Not sure about her memory because she was very distractable  and I sometimes had to ask the same question two or three times to get her  to focus on it.  Poor judgment.  She thought she was going home.  Was asking  for somebody to drive her home.  Kept focusing on having gotten a shot so  she would not have to take any more medications.  Affect was initially  agitated but after the Geodon she said she felt a lot calmer and even a  little more on the sleepy side.   LABORATORY DATA:  Alcohol level less than five.  Drugs of abuse were  negative.  Potassium was minimally low at 3.4.  White count was 9.3 with 81%  neutrophils.  Minimal anemia at 11.1.  Urinalysis was negative.  EKG was  normal other than mild sinus tachycardia.   IMPRESSION:  1.  Sinus tachycardia secondary to agitation and that has now cleared.  2.   Bipolar.  The patient says herself that she was improved after the shot      of Geodon.  3.  Mild drug-induced Parkinson's.  4.  Mild hypertension.  This has now corrected.  I am not sure if this is a      true hypertension or if it was just from her agitation.   RECOMMENDATIONS:  1.  Toprol XL 50 mg daily.  2.  Avelox 400 mg daily for five days.  3.  I am medically clearing the patient and she can be admitted to Behavior      Medicine.   Thank you for the consultation.      Jonna L. Robb Matar, M.D.  Electronically Signed     JLB/MEDQ  D:  02/11/2005  T:  02/12/2005  Job:  [id number]

## 2010-11-28 NOTE — Discharge Summary (Signed)
NAMELAKEYN, DOKKEN             ACCOUNT NO.:  [id number]   MEDICAL RECORD NO.:  [id number]          PATIENT TYPE:  IPS   LOCATION:  0402                          FACILITY:  [facility]   PHYSICIAN:  Anselm Jungling, MD  DATE OF BIRTH:  [birth year]   DATE OF ADMISSION:  02/11/2005  DATE OF DISCHARGE:  02/19/2005                                 DISCHARGE SUMMARY   IDENTIFYING DATA AND REASON FOR ADMISSION:  This was the first [facility] admission  for [name], a 70 year old single African-American female, who was admitted  involuntarily.  Police had intervened after she had been found running naked  in the streets.  When she was restrained, she attempted to jump out of a  window.  She exhibited pressured speech, yelling at police officers, with  disorganized thoughts, irritability and labile mood.  She had indicated that  she had missed some of her oral Prolixin doses during the previous week.  She had missed her most recent Prolixin injection.   She came to use with a 10-year history of mental illness.  Please refer to  the admission for further details pertaining to the symptoms, circumstances  and history that lead to her hospitalization to [facility].  She did not come to [place]  with any known history of polysubstance abuse.   INITIAL DIAGNOSTIC IMPRESSION:  AXIS I:  Psychosis not otherwise specified.  AXIS II:  Deferred.  AXIS III:  History of hypertension.  Osteoarthritis.  AXIS IV:  Stressors severe.  AXIS V:  Global assessment of function 30.   MEDICAL AND LABORATORY:  The patient was physically examined upon admission  to the inpatient service.  Admission CBC and chemistry panel were reflective  of poor nutrition with reduced hemoglobin, hematocrit and albumin.  Liver  function tests were within normal limits with the exception of elevated AST  at 77, which normalized to 34 during the course of her hospital stay.  Hemoglobin A1c was slightly elevated at 6.4.  Ferritin level was reduced at  35, and iron binding saturation was 13.  TSH was within normal limits.   HOSPITAL COURSE:  The patient was admitted to the adult inpatient service  where she was quite disorganized and tangential but pleasant and  redirectable.  She was started on Prolixin 5 mg b.i.d., which was increased  step-wise to 7.5 mg b.i.d. and 10 mg b.i.d.  She was also given Cogentin 1  mg b.i.d., although not much in the way of EPS was noted.  Benadryl 25 mg at  h.s. was given.  To address hypertension, Lisinopril 10 mg p.o. daily was  instituted which was later increased to 15 mg daily.  She was given her  usual Premarin 1.25 mg daily, folate 1 mg daily, Colace and a multivitamin  with iron.   Over the course of her inpatient stay she stabilized very gradually.  We  were able to determine through family, that the patient had a pattern of  over-utilizing her medication early in the 30-day cycle, then running out,  and tending to become symptomatic towards the end of the cycle.   By  the time of discharge, the patient was significantly improved, although  she still displayed mild-to-moderate thought disorganization.   AFTERCARE:  Aftercare was approached with recognition that the patient had a  pattern of mistaking her medication and becoming symptomatic.  As such, it  was felt that it would be in her best interest to move to an assisted living  facility.  She was ultimately discharged to [facility].  She was to follow  up at [facility] on February 26, 2005 for medication  management.   DISCHARGE MEDICATIONS:  1.  Prolixin 10 mg b.i.d.  2.  Cogentin 1 mg b.i.d.  3.  Benadryl 25 mg q.h.s.  4.  Premarin 1.25 mg daily.  5.  Folic acid 1 mg daily.  6.  Lisinopril 10 mg daily.   DISCHARGE DIAGNOSES:  AXIS I:  Psychosis, not otherwise specified,  resolving.  AXIS II:  Deferred.  AXIS III:  History of hypertension.  Osteoarthritis.  AXIS IV:  Stressors severe.  AXIS V:  Global assessment of  function on discharge 50.           ______________________________  Anselm Jungling, MD  Electronically Signed     SPB/MEDQ  D:  03/13/2005  T:  03/13/2005  Job:  [id number]

## 2011-02-11 ENCOUNTER — Emergency Department (HOSPITAL_COMMUNITY)
Admission: EM | Admit: 2011-02-11 | Discharge: 2011-02-12 | Disposition: A | Discharge status: Home / Self Care | Payer: Medicare Other | Attending: Emergency Medicine | Admitting: Emergency Medicine

## 2011-02-11 DIAGNOSIS — M549 Dorsalgia, unspecified: Secondary | ICD-10-CM | POA: Insufficient documentation

## 2011-02-11 DIAGNOSIS — R269 Unspecified abnormalities of gait and mobility: Secondary | ICD-10-CM | POA: Insufficient documentation

## 2011-02-11 DIAGNOSIS — R5381 Other malaise: Secondary | ICD-10-CM | POA: Insufficient documentation

## 2011-02-11 DIAGNOSIS — M79609 Pain in unspecified limb: Secondary | ICD-10-CM | POA: Insufficient documentation

## 2011-02-11 DIAGNOSIS — K219 Gastro-esophageal reflux disease without esophagitis: Secondary | ICD-10-CM | POA: Insufficient documentation

## 2011-02-11 DIAGNOSIS — J45909 Unspecified asthma, uncomplicated: Secondary | ICD-10-CM | POA: Insufficient documentation

## 2011-02-11 DIAGNOSIS — G8929 Other chronic pain: Secondary | ICD-10-CM | POA: Insufficient documentation

## 2011-02-11 DIAGNOSIS — I10 Essential (primary) hypertension: Secondary | ICD-10-CM | POA: Insufficient documentation

## 2011-02-11 DIAGNOSIS — R5383 Other fatigue: Secondary | ICD-10-CM | POA: Insufficient documentation

## 2011-02-12 LAB — POCT I-STAT, CHEM 8
BUN: 19 mg/dL (ref 6–23)
Chloride: 100 mEq/L (ref 96–112)
Creatinine, Ser: 1.1 mg/dL (ref 0.50–1.10)
Sodium: 134 mEq/L — ABNORMAL LOW (ref 135–145)
TCO2: 22 mmol/L (ref 0–100)

## 2011-04-27 LAB — DIFFERENTIAL
Basophils Absolute: 0
Basophils Relative: 1
Eosinophils Absolute: 0.1
Lymphs Abs: 1.9
Neutro Abs: 2.2

## 2011-04-27 LAB — COMPREHENSIVE METABOLIC PANEL
ALT: 16
AST: 15
CO2: 25
Chloride: 110
GFR calc Af Amer: >60
GFR calc non Af Amer: >60
Potassium: 4
Sodium: 140
Total Bilirubin: 0.3

## 2011-04-27 LAB — URINALYSIS, ROUTINE W REFLEX MICROSCOPIC
Hgb urine dipstick: NEGATIVE
Nitrite: NEGATIVE
Specific Gravity, Urine: 1.013
Urobilinogen, UA: 0.2

## 2011-04-27 LAB — CBC
RBC: 4.92
WBC: 4.6

## 2011-07-23 ENCOUNTER — Encounter (HOSPITAL_COMMUNITY): Payer: Self-pay | Admitting: Emergency Medicine

## 2011-07-23 ENCOUNTER — Emergency Department (HOSPITAL_COMMUNITY)
Admission: EM | Admit: 2011-07-23 | Discharge: 2011-07-23 | Disposition: A | Discharge status: Home / Self Care | Payer: Medicare Other | Attending: Emergency Medicine | Admitting: Emergency Medicine

## 2011-07-23 ENCOUNTER — Emergency Department (HOSPITAL_COMMUNITY): Payer: Medicare Other

## 2011-07-23 DIAGNOSIS — S32409A Unspecified fracture of unspecified acetabulum, initial encounter for closed fracture: Secondary | ICD-10-CM | POA: Insufficient documentation

## 2011-07-23 DIAGNOSIS — X58XXXA Exposure to other specified factors, initial encounter: Secondary | ICD-10-CM | POA: Insufficient documentation

## 2011-07-23 DIAGNOSIS — R209 Unspecified disturbances of skin sensation: Secondary | ICD-10-CM | POA: Insufficient documentation

## 2011-07-23 DIAGNOSIS — S32402A Unspecified fracture of left acetabulum, initial encounter for closed fracture: Secondary | ICD-10-CM

## 2011-07-23 DIAGNOSIS — M25559 Pain in unspecified hip: Secondary | ICD-10-CM | POA: Insufficient documentation

## 2011-07-23 DIAGNOSIS — M79609 Pain in unspecified limb: Secondary | ICD-10-CM | POA: Insufficient documentation

## 2011-07-23 HISTORY — DX: Other specified health status: Z78.9

## 2011-07-23 LAB — URINALYSIS, ROUTINE W REFLEX MICROSCOPIC
Bilirubin Urine: NEGATIVE
Glucose, UA: NEGATIVE mg/dL
Hgb urine dipstick: NEGATIVE
Protein, ur: NEGATIVE mg/dL
Specific Gravity, Urine: 1.009 (ref 1.005–1.030)
Urobilinogen, UA: 0.2 mg/dL (ref 0.0–1.0)

## 2011-07-23 LAB — URINE CULTURE
Colony Count: NO GROWTH
Culture: NO GROWTH
Special Requests: NORMAL

## 2011-07-23 MED ORDER — ACETAMINOPHEN 325 MG PO TABS
650.0000 mg | ORAL_TABLET | Freq: Once | ORAL | Status: AC
  Administered 2011-07-23: 650 mg via ORAL
  Filled 2011-07-23 (×2): qty 1

## 2011-07-23 MED ORDER — OXYCODONE-ACETAMINOPHEN 5-325 MG PO TABS
1.0000 | ORAL_TABLET | Freq: Once | ORAL | Status: AC
  Administered 2011-07-23: 1 via ORAL
  Filled 2011-07-23: qty 1

## 2011-07-23 MED ORDER — HYDROCODONE-ACETAMINOPHEN 5-325 MG PO TABS: 1.0000 | ORAL_TABLET | Freq: Four times a day (QID) | ORAL | Status: AC | PRN

## 2011-07-23 NOTE — ED Provider Notes (Signed)
Medical screening examination/treatment/procedure(s) were conducted as a shared visit with non-physician practitioner(s) and myself.  I personally evaluated the patient during the encounter  Patient with chronic leg pain. X-ray shows significant arthritis with questionable fracture or spur. Patient be treated with pain medications we will consult orthopedics for about patient followup  Celene Kras, MD 07/23/11 1949

## 2011-07-23 NOTE — ED Notes (Signed)
Pt reports L inner thigh pain x 7 months.  Pt reports she's been seen for same in the past and had an xray done.  Pt reports she ambulates with a walker, and pain has became severe that she cannot put weight on it.

## 2011-07-23 NOTE — ED Provider Notes (Signed)
History     CSN: [id number]  Arrival date & time 07/23/11  1618   First MD Initiated Contact with Patient 07/23/11 1815      Chief Complaint  Patient presents with  . Leg Pain    (Consider location/radiation/quality/duration/timing/severity/associated sxs/prior treatment) HPI Comments: Patient reports pain in her left upper thigh x 7 months.  States she has been seen in the ED 6 months ago and had a normal xray but has continued to have pain, now having pain with walking.  Does state she is urinating more frequently than usual.  States she has pain with sitting on toilet as well.  Denies fevers, changes in pain, dysuria, weakness or numbness of the legs.    The history is provided by the patient.    Past Medical History  Diagnosis Date  . Diabetes mellitus   . Poor historian     Past Surgical History  Procedure Date  . Cesarean section     x4    No family history on file.  History  Substance Use Topics  . Smoking status: Never Smoker   . Smokeless tobacco: Not on file  . Alcohol Use: No    OB History    Grav Para Term Preterm Abortions TAB SAB Ect Mult Living                  Review of Systems  Allergies  Review of patient's allergies indicates no known allergies.  Home Medications  No current outpatient prescriptions on file.  BP 125/55  Pulse 83  Temp(Src) 98.5 F (36.9 C) (Oral)  Resp 18  SpO2 99%  Physical Exam  Constitutional: She is oriented to person, place, and time. She appears well-developed and well-nourished.  HENT:  Head: Normocephalic and atraumatic.  Neck: Neck supple.  Cardiovascular: Normal rate, regular rhythm and normal heart sounds.   Pulmonary/Chest: Breath sounds normal. No respiratory distress. She has no wheezes. She has no rales. She exhibits no tenderness.  Abdominal: Soft. Bowel sounds are normal. There is no tenderness.  Musculoskeletal: She exhibits tenderness.       Left hip: She exhibits tenderness.       Legs:    Distal pulses intact.  No peripheral edema.    Neurological: She is alert and oriented to person, place, and time.    ED Course  Procedures (including critical care time)   Labs Reviewed  URINALYSIS, ROUTINE W REFLEX MICROSCOPIC  URINE CULTURE   Dg Hip Complete Left  07/23/2011  *RADIOLOGY REPORT*  Clinical Data: Pain and tingling left hip, history diabetes  LEFT HIP - COMPLETE 2+ VIEW  Comparison: Pelvic radiograph 11/05/2000  Findings: Bones appear demineralized. Bilateral narrowing of the hip joints, greater on left. Bone on bone appearance of left hip joint with interval development of subchondral cyst formation at the left femoral head. Question interval fracture of a marginal spur at the superolateral acetabulum. Margins of this fragment appear indistinct, subacute in appearance. No acute fracture, dislocation or bone destruction.  IMPRESSION: Advanced osteoarthritic changes of the left hip with question subacute fracture of a spur at the superolateral margin of the left acetabulum.  Original Report Authenticated By: Lollie Marrow, M.D.    6:40 PM Patient seen and examined.  Dr Roselyn Bering is aware of patient.    8:19 PM Discussed patient and xray with Dr August Saucer.  States he would be happy to see patient in follow up in his office.    1. Acetabulum fracture, left  MDM  Elderly patient from assisted living with left hip/upper thigh pain x 7 months.  Only trauma reported is repeated sitting down hard on the toilet.  Patient found to have subacute fracture of spur of acetabulum.  Pt able to ambulate here with minor assistance from wall and staff - patient usually ambulates with walker.  Discussed patient with Dr August Saucer who will gladly see her in his office in follow up.         Dillard Cannon [place], [place] 07/24/11 [phone]

## 2011-07-23 NOTE — ED Notes (Addendum)
Pt with leg pain "for 8 or 9 months that just gets worse every week" Pt in left hip/upper thigh that is worse when ambulating. Pt has not f/u with her PCP about this and lives in assisted living.

## 2011-07-24 NOTE — ED Provider Notes (Signed)
Medical screening examination/treatment/procedure(s) were performed by non-physician practitioner and as supervising physician I was immediately available for consultation/collaboration.    Issam Carlyon R Javien Tesch, MD 07/24/11 0034 

## 2011-08-04 ENCOUNTER — Emergency Department (HOSPITAL_COMMUNITY)
Admission: EM | Admit: 2011-08-04 | Discharge: 2011-08-04 | Disposition: A | Discharge status: Skilled Nursing Facility | Payer: Medicare Other | Attending: Emergency Medicine | Admitting: Emergency Medicine

## 2011-08-04 ENCOUNTER — Encounter (HOSPITAL_COMMUNITY): Payer: Self-pay | Admitting: Student

## 2011-08-04 ENCOUNTER — Emergency Department (HOSPITAL_COMMUNITY): Payer: Medicare Other

## 2011-08-04 DIAGNOSIS — J45909 Unspecified asthma, uncomplicated: Secondary | ICD-10-CM | POA: Insufficient documentation

## 2011-08-04 DIAGNOSIS — M25552 Pain in left hip: Secondary | ICD-10-CM

## 2011-08-04 DIAGNOSIS — M25559 Pain in unspecified hip: Secondary | ICD-10-CM | POA: Insufficient documentation

## 2011-08-04 DIAGNOSIS — Z4789 Encounter for other orthopedic aftercare: Secondary | ICD-10-CM | POA: Insufficient documentation

## 2011-08-04 DIAGNOSIS — E119 Type 2 diabetes mellitus without complications: Secondary | ICD-10-CM | POA: Insufficient documentation

## 2011-08-04 DIAGNOSIS — Z8659 Personal history of other mental and behavioral disorders: Secondary | ICD-10-CM | POA: Insufficient documentation

## 2011-08-04 HISTORY — DX: Tachycardia, unspecified: R00.0

## 2011-08-04 HISTORY — DX: Anemia, unspecified: D64.9

## 2011-08-04 HISTORY — DX: Schizoaffective disorder, unspecified: F25.9

## 2011-08-04 HISTORY — DX: Shortness of breath: R06.02

## 2011-08-04 HISTORY — DX: Schizoaffective disorder, bipolar type: F25.0

## 2011-08-04 HISTORY — DX: Spinal stenosis, cervical region: M48.02

## 2011-08-04 HISTORY — DX: Hemorrhage of anus and rectum: K62.5

## 2011-08-04 LAB — DIFFERENTIAL
Basophils Absolute: 0 10*3/uL (ref 0.0–0.1)
Basophils Relative: 0 % (ref 0–1)
Eosinophils Absolute: 0.2 10*3/uL (ref 0.0–0.7)
Lymphocytes Relative: 33 % (ref 12–46)
Monocytes Absolute: 0.5 10*3/uL (ref 0.1–1.0)
Neutrophils Relative %: 54 % (ref 43–77)

## 2011-08-04 LAB — URINALYSIS, ROUTINE W REFLEX MICROSCOPIC
Nitrite: NEGATIVE
Protein, ur: NEGATIVE mg/dL
Specific Gravity, Urine: 1.018 (ref 1.005–1.030)
Urobilinogen, UA: 0.2 mg/dL (ref 0.0–1.0)

## 2011-08-04 LAB — BASIC METABOLIC PANEL
Calcium: 10.2 mg/dL (ref 8.4–10.5)
GFR calc Af Amer: 64 mL/min — ABNORMAL LOW (ref >90)
GFR calc non Af Amer: 55 mL/min — ABNORMAL LOW (ref >90)
Glucose, Bld: 96 mg/dL (ref 70–99)
Potassium: 4.1 mEq/L (ref 3.5–5.1)
Sodium: 136 mEq/L (ref 135–145)

## 2011-08-04 LAB — CBC
Hemoglobin: 11.3 g/dL — ABNORMAL LOW (ref 12.0–15.0)
MCHC: 30.9 g/dL (ref 30.0–36.0)
Platelets: 237 10*3/uL (ref 150–400)

## 2011-08-04 MED ORDER — HYDROCODONE-ACETAMINOPHEN 5-325 MG PO TABS: 1.0000 | ORAL_TABLET | ORAL | Status: DC | PRN

## 2011-08-04 MED ORDER — OXYCODONE-ACETAMINOPHEN 5-325 MG PO TABS
1.0000 | ORAL_TABLET | Freq: Once | ORAL | Status: AC
  Administered 2011-08-04: 1 via ORAL
  Filled 2011-08-04: qty 1

## 2011-08-04 MED ORDER — HYDROCODONE-ACETAMINOPHEN 5-325 MG PO TABS
1.0000 | ORAL_TABLET | Freq: Once | ORAL | Status: AC
  Administered 2011-08-04: 1 via ORAL
  Filled 2011-08-04: qty 1

## 2011-08-04 NOTE — ED Notes (Signed)
MD at bedside. 

## 2011-08-04 NOTE — ED Notes (Signed)
Pt attempted to ambulate, able to bear weight and move 1-2 steps forward and then unable to move anymore. Pt able to move legs in bed without difficulty and pain. Only reports increased pain with weight bearing activities.

## 2011-08-04 NOTE — ED Provider Notes (Signed)
History     CSN: [id number]  Arrival date & time 08/04/11  1028   First MD Initiated Contact with Patient 08/04/11 1049      Chief Complaint  Patient presents with  . Hip Pain    left pelvis fx x 12 days    (Consider location/radiation/quality/duration/timing/severity/associated sxs/prior treatment) HPI Comments: Patient reports she sat down on the toilet too hard and is hurting between her legs.  Denies falling.  Patient was seen in the ED 07/23/11 following 7 months of continued hip pain and reports of "sitting down on the toilet too hard" - at that time she was found to have a subacute fracture of an acetabular spur.  Per assisted living facility, patient refused to go to her follow up appointment with Dr August Saucer on 1/17.  For the past 2 days, they report they have had to assist her to stand and walk but are unaware of any recent falls.    Patient is a poor historian.    Patient is a 71 y.o. female presenting with hip pain. The history is provided by the patient.  Hip Pain    Past Medical History  Diagnosis Date  . Diabetes mellitus   . Poor historian   . Schizophrenia, schizo-affective   . Anemia   . Tachycardia   . Asthma   . Shortness of breath on exertion   . Rectal bleeding   . Cervical stenosis of spine     Past Surgical History  Procedure Date  . Cesarean section     x4    No family history on file.  History  Substance Use Topics  . Smoking status: Never Smoker   . Smokeless tobacco: Not on file  . Alcohol Use: No    OB History    Grav Para Term Preterm Abortions TAB SAB Ect Mult Living                  Review of Systems  Unable to perform ROS: Psychiatric disorder    Allergies  Review of patient's allergies indicates no known allergies.  Home Medications  No current outpatient prescriptions on file.  BP 112/66  Pulse 70  Temp 98.5 F (36.9 C)  Resp 22  SpO2 99%  Physical Exam  Nursing note and vitals reviewed. Constitutional: She is  oriented to person, place, and time. She appears well-developed and well-nourished.  HENT:  Head: Normocephalic and atraumatic.  Neck: Neck supple.  Cardiovascular: Normal rate, regular rhythm and normal heart sounds.   Pulmonary/Chest: Breath sounds normal. No respiratory distress. She has no wheezes. She has no rales. She exhibits no tenderness.  Abdominal: Soft. Bowel sounds are normal. She exhibits no distension. There is no tenderness. There is no rebound and no guarding.  Musculoskeletal:       Left hip: She exhibits decreased range of motion and tenderness. She exhibits no swelling, no crepitus and no deformity.       Legs:      Patient actively flexes both hips equally without complaints of pain.  There is pain in right hip with passive ROM.    Neurological: She is alert and oriented to person, place, and time.    ED Course  Procedures (including critical care time)   Labs Reviewed  URINALYSIS, ROUTINE W REFLEX MICROSCOPIC  CBC  DIFFERENTIAL  BASIC METABOLIC PANEL   No results found.  12:28 PM Discussed patient with Dr Ethelda Chick who has also seen the patient.    Patient able  to stand, able to move legs around in stretcher without pain, no complaints of pain.  When patient is asked to ambulate she does not want to move legs forward.  Discussed with Dr Ethelda Chick - also discussed with Mindi Junker, social work.  Patient may be discharged back to assisted living facility.  Patient may follow up with Dr August Saucer as previously discussed.    1. Left hip pain       MDM  Patient with chronic left hip pain with known subacute fracture of spur of left acetabulum.  No new injury be report, exam, or xray.  Patient has skipped follow up appointment with Dr August Saucer and per report is now out of pain medications and is having increased difficulty ambulating, requiring increased assistance.  Patient's pain controlled in ED, d/c home to assisted living with pain medication prescription and follow up  with Dr August Saucer.          Dillard Cannon [employer], [place] 08/04/11 [phone]

## 2011-08-04 NOTE — ED Notes (Signed)
Attempted to call report back to [facility], no answer. [facility] present to take pt home.

## 2011-08-04 NOTE — ED Provider Notes (Signed)
Medical screening examination/treatment/procedure(s) were conducted as a shared visit with non-physician practitioner(s) and myself.  I personally evaluated the patient during the encounter  Shakima Nisley, MD 08/04/11 2325 

## 2011-08-04 NOTE — ED Notes (Signed)
Spoke with pt's care giver at [facility] - per care giver, pt denied fall and refused ortho appt on 1/17 for f/u care. Reports increased pain x 2 days with difficulty ambulating and needing assistance with ADLs.

## 2011-08-04 NOTE — ED Notes (Signed)
[id number]<AV> Expected date:08/04/11<BR> Expected time:10:05 AM<BR> Means of arrival:Ambulance<BR> Comments:<BR> Hip fx

## 2011-08-04 NOTE — ED Provider Notes (Signed)
Complains of left hip pain for several months as I examined patient she is pain-free. On exam patient is alert pleasantly confused pelvis is stable nontender left lower extremity no deformity no pain on internal rotation of thigh neurovascularly intact  Doug Sou, MD 08/04/11 1229

## 2011-08-04 NOTE — ED Notes (Addendum)
Pt in from assisted living @ [facility] with c/o continued left hip and pelvis pain s/p visit on 07/23/2011. Presents to ED with c/o left hip pain, shortening and external rotation. Unknown source of injury, pt reports possible c/o injury while sitting on toilet too hard.

## 2011-11-15 ENCOUNTER — Emergency Department (HOSPITAL_COMMUNITY)
Admission: EM | Admit: 2011-11-15 | Discharge: 2011-11-15 | Disposition: A | Discharge status: Home / Self Care | Payer: Medicare Other | Attending: Emergency Medicine | Admitting: Emergency Medicine

## 2011-11-15 ENCOUNTER — Encounter (HOSPITAL_COMMUNITY): Payer: Self-pay | Admitting: Emergency Medicine

## 2011-11-15 DIAGNOSIS — M79609 Pain in unspecified limb: Secondary | ICD-10-CM | POA: Insufficient documentation

## 2011-11-15 DIAGNOSIS — R4182 Altered mental status, unspecified: Secondary | ICD-10-CM | POA: Insufficient documentation

## 2011-11-15 DIAGNOSIS — E119 Type 2 diabetes mellitus without complications: Secondary | ICD-10-CM | POA: Insufficient documentation

## 2011-11-15 DIAGNOSIS — I10 Essential (primary) hypertension: Secondary | ICD-10-CM

## 2011-11-15 DIAGNOSIS — J45909 Unspecified asthma, uncomplicated: Secondary | ICD-10-CM | POA: Insufficient documentation

## 2011-11-15 DIAGNOSIS — F259 Schizoaffective disorder, unspecified: Secondary | ICD-10-CM | POA: Insufficient documentation

## 2011-11-15 LAB — URINALYSIS, ROUTINE W REFLEX MICROSCOPIC
Glucose, UA: NEGATIVE mg/dL
Leukocytes, UA: NEGATIVE
Protein, ur: NEGATIVE mg/dL
Specific Gravity, Urine: 1.021 (ref 1.005–1.030)
pH: 6 (ref 5.0–8.0)

## 2011-11-15 LAB — CBC
Platelets: 247 10*3/uL (ref 150–400)
RBC: 4.59 MIL/uL (ref 3.87–5.11)
RDW: 15.5 % (ref 11.5–15.5)
WBC: 5.8 10*3/uL (ref 4.0–10.5)

## 2011-11-15 LAB — DIFFERENTIAL
Basophils Absolute: 0 10*3/uL (ref 0.0–0.1)
Lymphocytes Relative: 44 % (ref 12–46)
Neutro Abs: 2.5 10*3/uL (ref 1.7–7.7)

## 2011-11-15 LAB — BASIC METABOLIC PANEL
CO2: 24 mEq/L (ref 19–32)
Chloride: 97 mEq/L (ref 96–112)
Potassium: 4 mEq/L (ref 3.5–5.1)
Sodium: 134 mEq/L — ABNORMAL LOW (ref 135–145)

## 2011-11-15 NOTE — ED Notes (Signed)
Per EMS: Pt from [facility].  Staff called for high blood pressure however upon EMS arrival, pt's bp was 110 palpated (no cuff big enough for arm).  Pt's only complaint is a pressure ulcer in her lt hip crease that has been there since she got out of the hospital in January.

## 2011-11-15 NOTE — ED Provider Notes (Signed)
History     CSN: [id number]  Arrival date & time 11/15/11  1655   First MD Initiated Contact with Patient 11/15/11 1744      Chief Complaint  Patient presents with  . Leg Pain    lt leg    (Consider location/radiation/quality/duration/timing/severity/associated sxs/prior treatment) Patient is a 71 y.o. female presenting with leg pain.  Leg Pain    patient here due to high blood pressure at the nursing home. When they arrived her blood pressure was appropriate. She complains of ulcer that is chronic to her midthoracic back. No fever or vomiting. Denies any chest pain shortness of breath. No abdominal pain. She presented baseline at this time. He does note some dysuria  Past Medical History  Diagnosis Date  . Diabetes mellitus   . Poor historian   . Schizophrenia, schizo-affective   . Anemia   . Tachycardia   . Asthma   . Shortness of breath on exertion   . Rectal bleeding   . Cervical stenosis of spine     Past Surgical History  Procedure Date  . Cesarean section     x4    No family history on file.  History  Substance Use Topics  . Smoking status: Never Smoker   . Smokeless tobacco: Not on file  . Alcohol Use: No    OB History    Grav Para Term Preterm Abortions TAB SAB Ect Mult Living                  Review of Systems  All other systems reviewed and are negative.    Allergies  Review of patient's allergies indicates no known allergies.  Home Medications   Current Outpatient Rx  Name Route Sig Dispense Refill  . ACETAMINOPHEN 500 MG PO TABS Oral Take 1,000 mg by mouth 3 (three) times daily. Patient takes for arthritis pain.    Marland Kitchen ALPRAZOLAM 0.5 MG PO TABS Oral Take 0.5 mg by mouth 3 (three) times daily as needed. For agitation/anxiety    . AMLODIPINE BESYLATE 10 MG PO TABS Oral Take 10 mg by mouth daily.    . ASPIRIN 81 MG PO CHEW Oral Chew 81 mg by mouth daily.    Marland Kitchen BENZTROPINE MESYLATE 1 MG PO TABS Oral Take 1 mg by mouth 2 (two) times daily.      Marland Kitchen CALCIUM CITRATE 950 MG PO TABS Oral Take 1 tablet by mouth 2 (two) times daily.    . CHLORTHALIDONE 25 MG PO TABS Oral Take 25 mg by mouth daily.    Marland Kitchen DOCUSATE SODIUM 100 MG PO CAPS Oral Take 100 mg by mouth 2 (two) times daily.    . DONEPEZIL HCL 10 MG PO TABS Oral Take 10 mg by mouth at bedtime.    Marland Kitchen ESCITALOPRAM OXALATE 5 MG PO TABS Oral Take 5 mg by mouth daily.    Marland Kitchen FERROUS SULFATE 325 (65 FE) MG PO TABS Oral Take 325 mg by mouth daily with breakfast.    . GABAPENTIN 100 MG PO CAPS Oral Take 100 mg by mouth daily. Give daily at 3pm. Do not give with Risperidone.    Marland Kitchen HYDROCODONE-ACETAMINOPHEN 5-325 MG PO TABS Oral Take 1 tablet by mouth every 6 (six) hours as needed. For pain    . LISINOPRIL 40 MG PO TABS Oral Take 40 mg by mouth daily.    . MELOXICAM 15 MG PO TABS Oral Take 15 mg by mouth daily.    Marland Kitchen METFORMIN HCL 500  MG PO TABS Oral Take 500 mg by mouth 2 (two) times daily with a meal.    . MUPIROCIN 2 % EX OINT Topical Apply 1 application topically 3 (three) times daily. Apply to lesions on back for 2 weeks    . OMEPRAZOLE 20 MG PO CPDR Oral Take 20 mg by mouth daily.    Marland Kitchen POLYETHYLENE GLYCOL 3350 PO PACK Oral Take 17 g by mouth daily.    Marland Kitchen RISPERIDONE 3 MG PO TABS Oral Take 3 mg by mouth 2 (two) times daily.    . TOPIRAMATE 50 MG PO TABS Oral Take 50 mg by mouth 2 (two) times daily.    . TRAMADOL HCL 50 MG PO TABS Oral Take 50 mg by mouth every 4 (four) hours as needed. For pain      BP 104/57  Pulse 97  Temp(Src) 98.5 F (36.9 C) (Oral)  Resp 16  SpO2 99%  Physical Exam  Nursing note and vitals reviewed. Constitutional: She is oriented to person, place, and time. Vital signs are normal. She appears well-developed and well-nourished.  Non-toxic appearance. No distress.  HENT:  Head: Normocephalic and atraumatic.  Eyes: Conjunctivae, EOM and lids are normal. Pupils are equal, round, and reactive to light.  Neck: Normal range of motion. Neck supple. No tracheal deviation  present. No mass present.  Cardiovascular: Normal rate, regular rhythm and normal heart sounds.  Exam reveals no gallop.   No murmur heard. Pulmonary/Chest: Effort normal and breath sounds normal. No stridor. No respiratory distress. She has no decreased breath sounds. She has no wheezes. She has no rhonchi. She has no rales.  Abdominal: Soft. Normal appearance and bowel sounds are normal. She exhibits no distension. There is no tenderness. There is no rebound and no CVA tenderness.  Musculoskeletal: Normal range of motion. She exhibits no edema and no tenderness.  Neurological: She is alert and oriented to person, place, and time. She has normal strength. No cranial nerve deficit or sensory deficit. GCS eye subscore is 4. GCS verbal subscore is 5. GCS motor subscore is 6.  Skin: Skin is warm and dry. No abrasion and no rash noted.     Psychiatric: She has a normal mood and affect. Her speech is normal and behavior is normal.    ED Course  Procedures (including critical care time)   Labs Reviewed  CBC  DIFFERENTIAL  BASIC METABOLIC PANEL  URINALYSIS, ROUTINE W REFLEX MICROSCOPIC  URINE CULTURE   No results found.   No diagnosis found.    MDM  Labs and xrays noted, no acute medical condition        Toy Baker, MD 11/15/11 2021

## 2011-11-15 NOTE — ED Notes (Signed)
Bed:WA07<BR> Expected date:<BR> Expected time:<BR> Means of arrival:<BR> Comments:<BR> ems

## 2011-11-16 LAB — URINE CULTURE: Colony Count: NO GROWTH

## 2012-03-17 ENCOUNTER — Encounter (HOSPITAL_COMMUNITY): Payer: Self-pay | Admitting: *Deleted

## 2012-03-17 ENCOUNTER — Observation Stay (HOSPITAL_COMMUNITY)
Admission: EM | Admit: 2012-03-17 | Discharge: 2012-03-18 | Disposition: A | Discharge status: Skilled Nursing Facility | Payer: Medicare Other | Attending: Family Medicine | Admitting: Family Medicine

## 2012-03-17 ENCOUNTER — Emergency Department (HOSPITAL_COMMUNITY): Payer: Medicare Other

## 2012-03-17 DIAGNOSIS — E119 Type 2 diabetes mellitus without complications: Secondary | ICD-10-CM | POA: Insufficient documentation

## 2012-03-17 DIAGNOSIS — Z79899 Other long term (current) drug therapy: Secondary | ICD-10-CM | POA: Insufficient documentation

## 2012-03-17 DIAGNOSIS — F209 Schizophrenia, unspecified: Secondary | ICD-10-CM

## 2012-03-17 DIAGNOSIS — I10 Essential (primary) hypertension: Secondary | ICD-10-CM | POA: Diagnosis present

## 2012-03-17 DIAGNOSIS — R42 Dizziness and giddiness: Principal | ICD-10-CM | POA: Diagnosis present

## 2012-03-17 DIAGNOSIS — R110 Nausea: Secondary | ICD-10-CM | POA: Insufficient documentation

## 2012-03-17 DIAGNOSIS — F0390 Unspecified dementia without behavioral disturbance: Secondary | ICD-10-CM

## 2012-03-17 DIAGNOSIS — R519 Headache, unspecified: Secondary | ICD-10-CM | POA: Diagnosis present

## 2012-03-17 DIAGNOSIS — R51 Headache: Secondary | ICD-10-CM | POA: Insufficient documentation

## 2012-03-17 DIAGNOSIS — E871 Hypo-osmolality and hyponatremia: Secondary | ICD-10-CM | POA: Insufficient documentation

## 2012-03-17 DIAGNOSIS — K219 Gastro-esophageal reflux disease without esophagitis: Secondary | ICD-10-CM | POA: Diagnosis present

## 2012-03-17 LAB — POCT I-STAT, CHEM 8
Chloride: 93 mEq/L — ABNORMAL LOW (ref 96–112)
HCT: 34 % — ABNORMAL LOW (ref 36.0–46.0)
Hemoglobin: 11.6 g/dL — ABNORMAL LOW (ref 12.0–15.0)
Potassium: 4.2 mEq/L (ref 3.5–5.1)
Sodium: 125 mEq/L — ABNORMAL LOW (ref 135–145)

## 2012-03-17 LAB — BASIC METABOLIC PANEL
BUN: 11 mg/dL (ref 6–23)
CO2: 23 mEq/L (ref 19–32)
Calcium: 9.4 mg/dL (ref 8.4–10.5)
Calcium: 9.4 mg/dL (ref 8.4–10.5)
Chloride: 93 mEq/L — ABNORMAL LOW (ref 96–112)
Creatinine, Ser: 0.82 mg/dL (ref 0.50–1.10)
GFR calc Af Amer: 83 mL/min — ABNORMAL LOW (ref >90)
GFR calc Af Amer: 85 mL/min — ABNORMAL LOW (ref >90)
GFR calc Af Amer: >90 mL/min (ref >90)
GFR calc non Af Amer: 71 mL/min — ABNORMAL LOW (ref >90)
GFR calc non Af Amer: 74 mL/min — ABNORMAL LOW (ref >90)
GFR calc non Af Amer: 83 mL/min — ABNORMAL LOW (ref >90)
Glucose, Bld: 110 mg/dL — ABNORMAL HIGH (ref 70–99)
Potassium: 3.8 mEq/L (ref 3.5–5.1)
Potassium: 4 mEq/L (ref 3.5–5.1)
Sodium: 126 mEq/L — ABNORMAL LOW (ref 135–145)
Sodium: 127 mEq/L — ABNORMAL LOW (ref 135–145)
Sodium: 129 mEq/L — ABNORMAL LOW (ref 135–145)

## 2012-03-17 LAB — CBC WITH DIFFERENTIAL/PLATELET
Basophils Relative: 0 % (ref 0–1)
Eosinophils Relative: 4 % (ref 0–5)
Lymphs Abs: 1.2 10*3/uL (ref 0.7–4.0)
MCH: 21.9 pg — ABNORMAL LOW (ref 26.0–34.0)
MCV: 65.2 fL — ABNORMAL LOW (ref 78.0–100.0)
Monocytes Absolute: 0.4 10*3/uL (ref 0.1–1.0)
Platelets: 319 10*3/uL (ref 150–400)
RBC: 4.83 MIL/uL (ref 3.87–5.11)

## 2012-03-17 LAB — COMPREHENSIVE METABOLIC PANEL
BUN: 16 mg/dL (ref 6–23)
CO2: 22 mEq/L (ref 19–32)
Chloride: 84 mEq/L — ABNORMAL LOW (ref 96–112)
Creatinine, Ser: 0.91 mg/dL (ref 0.50–1.10)
GFR calc non Af Amer: 63 mL/min — ABNORMAL LOW (ref >90)
Glucose, Bld: 90 mg/dL (ref 70–99)
Total Bilirubin: 0.2 mg/dL — ABNORMAL LOW (ref 0.3–1.2)

## 2012-03-17 LAB — OSMOLALITY: Osmolality: 270 mOsm/kg — ABNORMAL LOW (ref 275–300)

## 2012-03-17 LAB — URINALYSIS, ROUTINE W REFLEX MICROSCOPIC
Bilirubin Urine: NEGATIVE
Glucose, UA: NEGATIVE mg/dL
Ketones, ur: NEGATIVE mg/dL
pH: 7 (ref 5.0–8.0)

## 2012-03-17 LAB — NA AND K (SODIUM & POTASSIUM), RAND UR: Sodium, Ur: 27 mEq/L

## 2012-03-17 LAB — LIPASE, BLOOD: Lipase: 26 U/L (ref 11–59)

## 2012-03-17 MED ORDER — DOCUSATE SODIUM 100 MG PO CAPS
100.0000 mg | ORAL_CAPSULE | Freq: Every day | ORAL | Status: DC | PRN
  Filled 2012-03-17: qty 1

## 2012-03-17 MED ORDER — TOPIRAMATE 25 MG PO TABS
50.0000 mg | ORAL_TABLET | Freq: Two times a day (BID) | ORAL | Status: DC
  Administered 2012-03-17 – 2012-03-18 (×2): 50 mg via ORAL
  Filled 2012-03-17 (×3): qty 2

## 2012-03-17 MED ORDER — ONDANSETRON HCL 4 MG/2ML IJ SOLN: 4.0000 mg | Freq: Four times a day (QID) | INTRAMUSCULAR | Status: DC | PRN

## 2012-03-17 MED ORDER — BENZTROPINE MESYLATE 1 MG PO TABS
1.0000 mg | ORAL_TABLET | Freq: Two times a day (BID) | ORAL | Status: DC
  Administered 2012-03-17 – 2012-03-18 (×2): 1 mg via ORAL
  Filled 2012-03-17 (×3): qty 1

## 2012-03-17 MED ORDER — ALPRAZOLAM 0.5 MG PO TABS: 0.5000 mg | ORAL_TABLET | Freq: Three times a day (TID) | ORAL | Status: DC | PRN

## 2012-03-17 MED ORDER — ESCITALOPRAM OXALATE 5 MG PO TABS
5.0000 mg | ORAL_TABLET | Freq: Every day | ORAL | Status: DC
  Administered 2012-03-18: 5 mg via ORAL
  Filled 2012-03-17: qty 1

## 2012-03-17 MED ORDER — METFORMIN HCL 500 MG PO TABS
500.0000 mg | ORAL_TABLET | Freq: Two times a day (BID) | ORAL | Status: DC
  Administered 2012-03-17 – 2012-03-18 (×2): 500 mg via ORAL
  Filled 2012-03-17 (×4): qty 1

## 2012-03-17 MED ORDER — ASPIRIN 81 MG PO CHEW
81.0000 mg | CHEWABLE_TABLET | Freq: Every day | ORAL | Status: DC
  Administered 2012-03-18: 81 mg via ORAL
  Filled 2012-03-17: qty 1

## 2012-03-17 MED ORDER — ONDANSETRON HCL 4 MG PO TABS: 4.0000 mg | ORAL_TABLET | Freq: Four times a day (QID) | ORAL | Status: DC | PRN

## 2012-03-17 MED ORDER — HYDROCODONE-ACETAMINOPHEN 5-325 MG PO TABS
1.0000 | ORAL_TABLET | Freq: Four times a day (QID) | ORAL | Status: DC | PRN
  Administered 2012-03-17 – 2012-03-18 (×3): 1 via ORAL
  Filled 2012-03-17 (×3): qty 1

## 2012-03-17 MED ORDER — LISINOPRIL 40 MG PO TABS
40.0000 mg | ORAL_TABLET | Freq: Every day | ORAL | Status: DC
  Administered 2012-03-17 – 2012-03-18 (×2): 40 mg via ORAL
  Filled 2012-03-17 (×3): qty 1

## 2012-03-17 MED ORDER — FERROUS SULFATE 325 (65 FE) MG PO TABS
325.0000 mg | ORAL_TABLET | Freq: Every day | ORAL | Status: DC
  Administered 2012-03-18: 325 mg via ORAL
  Filled 2012-03-17 (×2): qty 1

## 2012-03-17 MED ORDER — PANTOPRAZOLE SODIUM 40 MG PO TBEC
40.0000 mg | DELAYED_RELEASE_TABLET | Freq: Every day | ORAL | Status: DC
  Filled 2012-03-17: qty 1

## 2012-03-17 MED ORDER — GABAPENTIN 100 MG PO CAPS
100.0000 mg | ORAL_CAPSULE | Freq: Every day | ORAL | Status: DC
  Administered 2012-03-17 – 2012-03-18 (×2): 100 mg via ORAL
  Filled 2012-03-17 (×4): qty 1

## 2012-03-17 MED ORDER — RISPERIDONE 3 MG PO TABS
3.0000 mg | ORAL_TABLET | Freq: Two times a day (BID) | ORAL | Status: DC
  Administered 2012-03-17 – 2012-03-18 (×2): 3 mg via ORAL
  Filled 2012-03-17 (×3): qty 1

## 2012-03-17 MED ORDER — ACETAMINOPHEN 500 MG PO TABS
1000.0000 mg | ORAL_TABLET | Freq: Three times a day (TID) | ORAL | Status: DC
  Administered 2012-03-17 – 2012-03-18 (×3): 1000 mg via ORAL
  Filled 2012-03-17 (×5): qty 2

## 2012-03-17 MED ORDER — ONDANSETRON HCL 4 MG/2ML IJ SOLN: 4.0000 mg | Freq: Three times a day (TID) | INTRAMUSCULAR | Status: DC | PRN

## 2012-03-17 MED ORDER — AMLODIPINE BESYLATE 10 MG PO TABS
10.0000 mg | ORAL_TABLET | Freq: Every day | ORAL | Status: DC
  Administered 2012-03-17 – 2012-03-18 (×2): 10 mg via ORAL
  Filled 2012-03-17 (×3): qty 1

## 2012-03-17 MED ORDER — ONDANSETRON HCL 4 MG/2ML IJ SOLN
4.0000 mg | Freq: Once | INTRAMUSCULAR | Status: AC
  Administered 2012-03-17: 4 mg via INTRAVENOUS
  Filled 2012-03-17: qty 2

## 2012-03-17 MED ORDER — SODIUM CHLORIDE 0.9 % IV SOLN
INTRAVENOUS | Status: DC
  Administered 2012-03-17 (×2): via INTRAVENOUS

## 2012-03-17 MED ORDER — DONEPEZIL HCL 10 MG PO TABS
10.0000 mg | ORAL_TABLET | Freq: Every day | ORAL | Status: DC
  Administered 2012-03-17: 10 mg via ORAL
  Filled 2012-03-17 (×2): qty 1

## 2012-03-17 MED ORDER — METOPROLOL TARTRATE 25 MG PO TABS
25.0000 mg | ORAL_TABLET | Freq: Two times a day (BID) | ORAL | Status: DC
  Administered 2012-03-18: 25 mg via ORAL
  Filled 2012-03-17 (×3): qty 1

## 2012-03-17 NOTE — H&P (Signed)
[facility] History and Physical  [name] ZOX:[id number] DOB: Oct 20, 1940 DOA: 03/17/2012  Referring physician: Dr. Norlene Campbell PCP: Dr. Billee Cashing  Chief Complaint: Dizziness  HPI: [name] is a 71 y.o. female  Patient is a 71 y/o with history of dementia, schizophrenia, dizziness, and HTN.  Presents to the ED complaining of nausea and subsequently poor oral  Intake for the last week.  History is mainly obtained from chart, ED physician, and patient although patient is poor historian.  She denies any associated abdominal pain, diarrhea, and emesis.  The problem is persistent and gradual at onset.  Has not gotten better and she is not aware of anything that makes it better or worse.   In the ED patient was found to have a sodium of 120 initially and 125 on repeat without any intervention (no IVF administration).  We were asked to evaluate patient for further recommendations.  Review of Systems: Unable to properly assess due to dementia  Past Medical History  Diagnosis Date  . Diabetes mellitus   . Poor historian   . Schizophrenia, schizo-affective   . Anemia   . Tachycardia   . Asthma   . Shortness of breath on exertion   . Rectal bleeding   . Cervical stenosis of spine    Past Surgical History  Procedure Date  . Cesarean section     x4   Social History:  reports that she has never smoked. She has never used smokeless tobacco. She reports that she does not drink alcohol or use illicit drugs. Patient lives at facility Has limited participation in ADL's.  No Known Allergies  History reviewed. No pertinent family history. None reported when directly asked.  Prior to Admission medications   Medication Sig Start Date End Date Taking? Authorizing Provider  acetaminophen (TYLENOL) 500 MG tablet Take 1,000 mg by mouth 3 (three) times daily. Patient takes for arthritis pain.   Yes Historical Provider, MD  ALPRAZolam Prudy Feeler) 0.5 MG tablet Take 0.5 mg by mouth 3  (three) times daily as needed. For agitation/anxiety   Yes Historical Provider, MD  amLODipine (NORVASC) 10 MG tablet Take 10 mg by mouth daily.   Yes Historical Provider, MD  aspirin 81 MG chewable tablet Chew 81 mg by mouth daily.   Yes Historical Provider, MD  benztropine (COGENTIN) 1 MG tablet Take 1 mg by mouth 2 (two) times daily.   Yes Historical Provider, MD  chlorthalidone (HYGROTON) 25 MG tablet Take 25 mg by mouth daily.   Yes Historical Provider, MD  docusate sodium (COLACE) 100 MG capsule Take 100 mg by mouth 2 (two) times daily.   Yes Historical Provider, MD  donepezil (ARICEPT) 10 MG tablet Take 10 mg by mouth at bedtime.   Yes Historical Provider, MD  escitalopram (LEXAPRO) 5 MG tablet Take 5 mg by mouth daily.   Yes Historical Provider, MD  ferrous sulfate 325 (65 FE) MG tablet Take 325 mg by mouth daily with breakfast.   Yes Historical Provider, MD  gabapentin (NEURONTIN) 100 MG capsule Take 100 mg by mouth daily. Give daily at 3pm. Do not give with Risperidone.   Yes Historical Provider, MD  HYDROcodone-acetaminophen (NORCO) 5-325 MG per tablet Take 1 tablet by mouth every 6 (six) hours as needed. For pain 08/04/11  Yes [place]  lisinopril (PRINIVIL,ZESTRIL) 40 MG tablet Take 40 mg by mouth daily.   Yes Historical Provider, MD  metFORMIN (GLUCOPHAGE) 500 MG tablet Take 500 mg by mouth 2 (  two) times daily with a meal.   Yes Historical Provider, MD  metoprolol tartrate (LOPRESSOR) 25 MG tablet Take 25 mg by mouth 2 (two) times daily.   Yes Historical Provider, MD  omeprazole (PRILOSEC) 20 MG capsule Take 20 mg by mouth daily.   Yes Historical Provider, MD  polyethylene glycol (MIRALAX / GLYCOLAX) packet Take 17 g by mouth daily.   Yes Historical Provider, MD  risperiDONE (RISPERDAL) 3 MG tablet Take 3 mg by mouth 2 (two) times daily.   Yes Historical Provider, MD  topiramate (TOPAMAX) 50 MG tablet Take 50 mg by mouth 2 (two) times daily.   Yes Historical Provider, MD    traMADol (ULTRAM) 50 MG tablet Take 50 mg by mouth every 4 (four) hours as needed. For pain   Yes Historical Provider, MD   Physical Exam: Filed Vitals:   03/17/12 0606 03/17/12 0822  BP: 129/70 131/65  Pulse: 82 94  Temp: 97.5 F (36.4 C)   TempSrc: Oral   Resp: 18   SpO2: 100% 99%     General:  Pt in NAD, Alert and Awake  Eyes: EOMI, PERRLA  ENT: WNL's  Neck: no masses on visual inspection, no goiter  Cardiovascular: RRR, No MRG  Respiratory: CTA BL, no wheezes  Abdomen: Soft, NT  Skin: no diaphoresis, or rashes  Musculoskeletal: no clubbing or cyanosis   Psychiatric: tangential thinking  Neurologic: patient moves all extremities equally, no focal weaknesses   Labs on Admission:  Basic Metabolic Panel:  Lab 03/17/12 2952 03/17/12 0702  NA 125* 120*  K 4.2 3.9  CL 93* 84*  CO2 -- 22  GLUCOSE 96 90  BUN 15 16  CREATININE 1.00 0.91  CALCIUM -- 9.3  MG -- --  PHOS -- --   Liver Function Tests:  Lab 03/17/12 0702  AST 11  ALT 10  ALKPHOS 86  BILITOT 0.2*  PROT 7.3  ALBUMIN 3.8    Lab 03/17/12 0702  LIPASE 26  AMYLASE --   No results found for this basename: AMMONIA:5 in the last 168 hours CBC:  Lab 03/17/12 0822 03/17/12 0620  WBC -- 4.1  NEUTROABS -- 2.3  HGB 11.6* 10.6*  HCT 34.0* 31.5*  MCV -- 65.2*  PLT -- 319   Cardiac Enzymes: No results found for this basename: CKTOTAL:5,CKMB:5,CKMBINDEX:5,TROPONINI:5 in the last 168 hours  BNP (last 3 results) No results found for this basename: PROBNP:3 in the last 8760 hours CBG: No results found for this basename: GLUCAP:5 in the last 168 hours  Radiological Exams on Admission: Ct Head Wo Contrast  03/17/2012  *RADIOLOGY REPORT*  Clinical Data:  Nausea, vomiting, headache  CT HEAD WITHOUT CONTRAST  Technique:  Contiguous axial images were obtained from the base of the skull through the vertex without contrast  Comparison:  None.  Findings:  The brain has a normal appearance without  evidence for hemorrhage, acute infarction, hydrocephalus, or mass lesion.  There is no extra axial fluid collection.  The skull and paranasal sinuses are normal.  IMPRESSION: Normal CT of the head without contrast.   Original Report Authenticated By: Judie Petit. Ruel Favors, M.D.    Telemetry monitoring showed normal sinus rhythm during time of examination with no ST elevations or depressions  Assessment/Plan Active Problems:  SCHIZOPHRENIA  HYPERTENSION, UNSPECIFIED  DIZZINESS  Headache  HTN (hypertension)  GERD (gastroesophageal reflux disease)  Dementia   1. Hyponatremia: - Likely causing dizziness, nausea, and suspect this is a chronic problem.  Most likely a  side effect to current medications chlorthalidone and Lexapro; Also made worse by poor oral intake and suspect that poor oral solute intake is making problem worse. - supportive care and antiemetics - Hold chlorthalidone and place on regular diet - monitor BMP's q 4 hours as not to over correct too rapidly. Patient on repeat sodium check had level of 125 without intervention reportedly.  Will aim for not exceeding 135 in a 24 hour period. - Sodium urine and osmolality, serum osmolality to rule out SIADH (less likely) - Place on gentle fluid hydration with normal saline.  2. Dizziness - Likely secondary to # 1 - CT of head negative  3. HTN - Continue home regimen but hold chlorthalidone as it may be contributing to hyponatremia  4. Nausea - Should improved with correction of sodium levels - Treat with antiemetics at this juncture.  5. Dementia - stable continue home regimen  6. Schizophrenia - Continue home regimen.   Code Status: Full Family Communication: No family at bedside Disposition Plan: Pending improvement of sodium levels may d/c back to facility with close follow up with primary care physician and psychiatrist  Time spent: > 60 minutes  Penny Pia [facility] Pager [phone]  If 7PM-7AM, please  contact night-coverage [email or web address] Password [email or web address] 03/17/2012, 11:08 AM

## 2012-03-17 NOTE — ED Notes (Signed)
Per EMS: pt coming from [facility] c/o nausea for one week. Pt reports vomiting once this morning. A&O, denies pain, diarrhea, or any associated symtpoms

## 2012-03-17 NOTE — ED Notes (Signed)
Report called to [place] Rn pt awaiting transport to floor

## 2012-03-17 NOTE — ED Notes (Signed)
Lab needs a recollect for cmet

## 2012-03-17 NOTE — ED Notes (Signed)
ZOX:[id number]<AV> Expected date:<BR> Expected time:<BR> Means of arrival:<BR> Comments:<BR> EMS/&quot;upset stomach&quot;

## 2012-03-17 NOTE — ED Provider Notes (Addendum)
History     CSN: [id number]  Arrival date & time 03/17/12  7829   First MD Initiated Contact with Patient 03/17/12 [phone]      Chief Complaint  Patient presents with  . Nausea    (Consider location/radiation/quality/duration/timing/severity/associated sxs/prior treatment) HPI 71 year-old the female presents to emergency room via EMS with complaint of nausea. Patient reports she has had nausea and upset stomach since birth. EMS reports she's had nausea for the last week and an episode of vomiting today. She denies any fever, no abdominal pain. She's complaining of left pelvis pain after prior fracture. Patient reports she eats dinner last night, and felt like the beats were undercooked. Upon waking this morning she had an episode of vomiting after having a bowel movement. Patient reports she accidentally went to bed and she was able to get from the bathroom into her adult diapers fast enough. She denies any urinary burning, no recent bladder infections. She denies any abdominal surgeries. Patient is a poor historian, has history of schizophrenia and dementia.  . Past Medical History  Diagnosis Date  . Diabetes mellitus   . Poor historian   . Schizophrenia, schizo-affective   . Anemia   . Tachycardia   . Asthma   . Shortness of breath on exertion   . Rectal bleeding   . Cervical stenosis of spine     Past Surgical History  Procedure Date  . Cesarean section     x4    History reviewed. No pertinent family history.  History  Substance Use Topics  . Smoking status: Never Smoker   . Smokeless tobacco: Not on file  . Alcohol Use: No    OB History    Grav Para Term Preterm Abortions TAB SAB Ect Mult Living                  Review of Systems  Unable to perform ROS: Dementia    Allergies  Review of patient's allergies indicates no known allergies.  Home Medications   Current Outpatient Rx  Name Route Sig Dispense Refill  . ACETAMINOPHEN 500 MG PO TABS Oral Take 1,000  mg by mouth 3 (three) times daily. Patient takes for arthritis pain.    Marland Kitchen ALPRAZOLAM 0.5 MG PO TABS Oral Take 0.5 mg by mouth 3 (three) times daily as needed. For agitation/anxiety    . AMLODIPINE BESYLATE 10 MG PO TABS Oral Take 10 mg by mouth daily.    . ASPIRIN 81 MG PO CHEW Oral Chew 81 mg by mouth daily.    Marland Kitchen BENZTROPINE MESYLATE 1 MG PO TABS Oral Take 1 mg by mouth 2 (two) times daily.    . CHLORTHALIDONE 25 MG PO TABS Oral Take 25 mg by mouth daily.    Marland Kitchen DOCUSATE SODIUM 100 MG PO CAPS Oral Take 100 mg by mouth 2 (two) times daily.    . DONEPEZIL HCL 10 MG PO TABS Oral Take 10 mg by mouth at bedtime.    Marland Kitchen ESCITALOPRAM OXALATE 5 MG PO TABS Oral Take 5 mg by mouth daily.    Marland Kitchen FERROUS SULFATE 325 (65 FE) MG PO TABS Oral Take 325 mg by mouth daily with breakfast.    . GABAPENTIN 100 MG PO CAPS Oral Take 100 mg by mouth daily. Give daily at 3pm. Do not give with Risperidone.    Marland Kitchen HYDROCODONE-ACETAMINOPHEN 5-325 MG PO TABS Oral Take 1 tablet by mouth every 6 (six) hours as needed. For pain    .  LISINOPRIL 40 MG PO TABS Oral Take 40 mg by mouth daily.    Marland Kitchen METFORMIN HCL 500 MG PO TABS Oral Take 500 mg by mouth 2 (two) times daily with a meal.    . METOPROLOL TARTRATE 25 MG PO TABS Oral Take 25 mg by mouth 2 (two) times daily.    Marland Kitchen OMEPRAZOLE 20 MG PO CPDR Oral Take 20 mg by mouth daily.    Marland Kitchen POLYETHYLENE GLYCOL 3350 PO PACK Oral Take 17 g by mouth daily.    Marland Kitchen RISPERIDONE 3 MG PO TABS Oral Take 3 mg by mouth 2 (two) times daily.    . TOPIRAMATE 50 MG PO TABS Oral Take 50 mg by mouth 2 (two) times daily.    . TRAMADOL HCL 50 MG PO TABS Oral Take 50 mg by mouth every 4 (four) hours as needed. For pain      BP 129/70  Pulse 82  Temp 97.5 F (36.4 C) (Oral)  Resp 18  SpO2 100%  Physical Exam  Nursing note and vitals reviewed. Constitutional: She is oriented to person, place, and time. She appears well-developed and well-nourished. No distress.  HENT:  Head: Normocephalic and atraumatic.    Nose: Nose normal.  Mouth/Throat: Oropharynx is clear and moist.  Eyes: Conjunctivae and EOM are normal. Pupils are equal, round, and reactive to light.  Neck: Normal range of motion. Neck supple. No JVD present. No tracheal deviation present. No thyromegaly present.  Cardiovascular: Normal rate, regular rhythm, normal heart sounds and intact distal pulses.  Exam reveals no gallop and no friction rub.   No murmur heard. Pulmonary/Chest: Effort normal and breath sounds normal. No stridor. No respiratory distress. She has no wheezes. She has no rales. She exhibits no tenderness.  Abdominal: Soft. She exhibits no distension and no mass. There is no tenderness. There is no rebound and no guarding.       Hyperactive bowel sounds  Musculoskeletal: Normal range of motion. She exhibits no edema and no tenderness.  Lymphadenopathy:    She has no cervical adenopathy.  Neurological: She is alert and oriented to person, place, and time. Coordination normal.  Skin: Skin is warm and dry. No rash noted. No erythema. No pallor.    ED Course  Procedures (including critical care time)  Labs Reviewed  CBC WITH DIFFERENTIAL - Abnormal; Notable for the following:    Hemoglobin 10.6 (*)     HCT 31.5 (*)     MCV 65.2 (*)     MCH 21.9 (*)     RDW 15.9 (*)     All other components within normal limits  COMPREHENSIVE METABOLIC PANEL - Abnormal; Notable for the following:    Sodium 120 (*)     Chloride 84 (*)     Total Bilirubin 0.2 (*)     GFR calc non Af Amer 63 (*)     GFR calc Af Amer 73 (*)     All other components within normal limits  POCT I-STAT, CHEM 8 - Abnormal; Notable for the following:    Sodium 125 (*)     Chloride 93 (*)     Hemoglobin 11.6 (*)     HCT 34.0 (*)     All other components within normal limits  URINALYSIS, ROUTINE W REFLEX MICROSCOPIC  LIPASE, BLOOD  NA AND K (SODIUM & POTASSIUM), RAND UR   Ct Head Wo Contrast  03/17/2012  *RADIOLOGY REPORT*  Clinical Data:  Nausea,  vomiting, headache  CT HEAD  WITHOUT CONTRAST  Technique:  Contiguous axial images were obtained from the base of the skull through the vertex without contrast  Comparison:  None.  Findings:  The brain has a normal appearance without evidence for hemorrhage, acute infarction, hydrocephalus, or mass lesion.  There is no extra axial fluid collection.  The skull and paranasal sinuses are normal.  IMPRESSION: Normal CT of the head without contrast.   Original Report Authenticated By: Judie Petit. Ruel Favors, M.D.      1. Hyponatremia       MDM  71 year old female with nausea and an episode of vomiting today. We'll check baseline labs,  give Zofran and reassess.  8:53 AM D/w on call hospitalist, Dr Cena Benton, who will come down to see/admit patient given hyponatremia.          Olivia Mackie, MD 03/17/12 1610  Olivia Mackie, MD 03/17/12 0900

## 2012-03-17 NOTE — Progress Notes (Signed)
Dr Cena Benton sent text page to inform of pt to the unit.

## 2012-03-18 DIAGNOSIS — K219 Gastro-esophageal reflux disease without esophagitis: Secondary | ICD-10-CM

## 2012-03-18 LAB — BASIC METABOLIC PANEL
CO2: 22 mEq/L (ref 19–32)
CO2: 23 mEq/L (ref 19–32)
CO2: 26 mEq/L (ref 19–32)
Calcium: 9.2 mg/dL (ref 8.4–10.5)
Chloride: 93 mEq/L — ABNORMAL LOW (ref 96–112)
Chloride: 93 mEq/L — ABNORMAL LOW (ref 96–112)
Chloride: 94 mEq/L — ABNORMAL LOW (ref 96–112)
Chloride: 95 mEq/L — ABNORMAL LOW (ref 96–112)
Creatinine, Ser: 0.95 mg/dL (ref 0.50–1.10)
Creatinine, Ser: 1.01 mg/dL (ref 0.50–1.10)
GFR calc Af Amer: 64 mL/min — ABNORMAL LOW (ref >90)
GFR calc Af Amer: 69 mL/min — ABNORMAL LOW (ref >90)
GFR calc Af Amer: 80 mL/min — ABNORMAL LOW (ref >90)
Potassium: 4.1 mEq/L (ref 3.5–5.1)
Sodium: 128 mEq/L — ABNORMAL LOW (ref 135–145)
Sodium: 129 mEq/L — ABNORMAL LOW (ref 135–145)
Sodium: 130 mEq/L — ABNORMAL LOW (ref 135–145)

## 2012-03-18 LAB — CBC
HCT: 29.7 % — ABNORMAL LOW (ref 36.0–46.0)
Platelets: 284 10*3/uL (ref 150–400)
RBC: 4.47 MIL/uL (ref 3.87–5.11)
RDW: 16.1 % — ABNORMAL HIGH (ref 11.5–15.5)
WBC: 3.9 10*3/uL — ABNORMAL LOW (ref 4.0–10.5)

## 2012-03-18 MED ORDER — ONDANSETRON 4 MG PO TBDP: 4.0000 mg | ORAL_TABLET | Freq: Three times a day (TID) | ORAL | Status: AC | PRN

## 2012-03-18 NOTE — Discharge Summary (Signed)
Physician Discharge Summary  [name]:[id number] DOB: 16-Apr-1941 DOA: 03/17/2012  PCP: Dr. Billee Cashing  Admit date: 03/17/2012 Discharge date: 03/18/2012  Recommendations for Outpatient Follow-up:  1. Please follow up on sodium levels and blood pressure  Discharge Diagnoses:  Active Problems:  SCHIZOPHRENIA  HYPERTENSION, UNSPECIFIED  DIZZINESS  Headache  HTN (hypertension)  GERD (gastroesophageal reflux disease)  Dementia   Discharge Condition: stable  Diet recommendation: cardiac diet (no salt restriction for the next 2 weeks then may limit to 2 gm per day)  Filed Weights   03/17/12 1413  Weight: 85.548 kg (188 lb 9.6 oz)    History of present illness:  From original HPI: [name] is a 71 y.o. female  Patient is a 71 y/o with history of dementia, schizophrenia, dizziness, and HTN. Presents to the ED complaining of nausea and subsequently poor oral Intake for the last week. History is mainly obtained from chart, ED physician, and patient although patient is poor historian. She denies any associated abdominal pain, diarrhea, and emesis. The problem is persistent and gradual at onset. Has not gotten better and she is not aware of anything that makes it better or worse.  In the ED patient was found to have a sodium of 120 initially and 125 on repeat without any intervention (no IVF administration). We were asked to evaluate patient for further recommendations.  Hospital Course:  1. Hyponatremia: - Likely causing dizziness, nausea, and suspect this is a chronic problem. Most likely a side effect to current medications chlorthalidone and Lexapro; Also made worse by poor oral intake and suspect that poor oral solute intake is making problem worse.  - Patient improved with IVF's and discontinuation of her chlorthalidone.  Will discontinue her chlorthalidone as outpatient and have her follow up with her primary care physician for further recommendations. - Also  will recommend to salt restriction for the next 2 weeks - Unable to discontinue her Lexapro due to avoidance of adverse effects associated with abrupt discontinuation.  2. Dizziness  - Was secondary to hyponatremia and resolved with improvement in sodium levels.  Her last sodium level was 130 and is expected to improve with no salt restriction diet and discontinuation of her chlorthalidone. - CT of head negative   3. HTN  - Continue home regimen but hold chlorthalidone as it is contributing to hyponatremia  - Blood pressures relatively well controlled off of her chlorthalidone and last recorded was 96/62  4. Nausea  - Resolved with resolution of hyponatremia - Will discharge with zofran prn nausea/emesis  5. Dementia  - stable continue home regimen   6. Schizophrenia  - Continue home regimen.    Procedures:  none  Consultations:  none  Discharge Exam: Filed Vitals:   03/18/12 0534  BP: 96/62  Pulse: 71  Temp: 98 F (36.7 C)  Resp: 18   Filed Vitals:   03/17/12 1413 03/17/12 1545 03/17/12 2112 03/18/12 0534  BP: 98/62 140/86 96/54 96/62   Pulse: 125 105 76 71  Temp: 98.2 F (36.8 C) 97.8 F (36.6 C) 98.1 F (36.7 C) 98 F (36.7 C)  TempSrc: Oral Oral Oral Oral  Resp: 18 18 20 18   Height: 5\' 2"  (1.575 m)     Weight: 85.548 kg (188 lb 9.6 oz)     SpO2:  99% 95% 97%    General: Pt in NAD, Alert and awake Cardiovascular: RRR, No MRG Respiratory: CTA BL, No wheezes Abdomen: non distended, soft, nt  Discharge Instructions  Discharge Orders    Future Orders Please Complete By Expires   Diet - low sodium heart healthy      Diet - low sodium heart healthy      Increase activity slowly      Discharge instructions      Comments:   Please follow up with your pcp in 1-2 weeks.  I have discontinued your chlorthalidone secondary to its ability to cause hyponatremia.   You will need to have a follow up to check your sodium and blood pressure levels.  Also may  take Zofran for any nausea   Call MD for:  temperature >100.4      Call MD for:  persistant nausea and vomiting      Call MD for:  persistant dizziness or light-headedness      Call MD for:  extreme fatigue      Increase activity slowly        Medication List  As of 03/18/2012  8:13 AM   STOP taking these medications         chlorthalidone 25 MG tablet         TAKE these medications         acetaminophen 500 MG tablet   Commonly known as: TYLENOL   Take 1,000 mg by mouth 3 (three) times daily. Patient takes for arthritis pain.      ALPRAZolam 0.5 MG tablet   Commonly known as: XANAX   Take 0.5 mg by mouth 3 (three) times daily as needed. For agitation/anxiety      amLODipine 10 MG tablet   Commonly known as: NORVASC   Take 10 mg by mouth daily.      aspirin 81 MG chewable tablet   Chew 81 mg by mouth daily.      benztropine 1 MG tablet   Commonly known as: COGENTIN   Take 1 mg by mouth 2 (two) times daily.      docusate sodium 100 MG capsule   Commonly known as: COLACE   Take 100 mg by mouth 2 (two) times daily.      donepezil 10 MG tablet   Commonly known as: ARICEPT   Take 10 mg by mouth at bedtime.      escitalopram 5 MG tablet   Commonly known as: LEXAPRO   Take 5 mg by mouth daily.      ferrous sulfate 325 (65 FE) MG tablet   Take 325 mg by mouth daily with breakfast.      gabapentin 100 MG capsule   Commonly known as: NEURONTIN   Take 100 mg by mouth daily. Give daily at 3pm. Do not give with Risperidone.      HYDROcodone-acetaminophen 5-325 MG per tablet   Commonly known as: NORCO/VICODIN   Take 1 tablet by mouth every 6 (six) hours as needed. For pain      lisinopril 40 MG tablet   Commonly known as: PRINIVIL,ZESTRIL   Take 40 mg by mouth daily.      metFORMIN 500 MG tablet   Commonly known as: GLUCOPHAGE   Take 500 mg by mouth 2 (two) times daily with a meal.      metoprolol tartrate 25 MG tablet   Commonly known as: LOPRESSOR   Take 25 mg  by mouth 2 (two) times daily.      omeprazole 20 MG capsule   Commonly known as: PRILOSEC   Take 20 mg by mouth daily.      ondansetron 4  MG disintegrating tablet   Commonly known as: ZOFRAN-ODT   Take 1 tablet (4 mg total) by mouth every 8 (eight) hours as needed for nausea.      polyethylene glycol packet   Commonly known as: MIRALAX / GLYCOLAX   Take 17 g by mouth daily.      risperiDONE 3 MG tablet   Commonly known as: RISPERDAL   Take 3 mg by mouth 2 (two) times daily.      topiramate 50 MG tablet   Commonly known as: TOPAMAX   Take 50 mg by mouth 2 (two) times daily.      traMADol 50 MG tablet   Commonly known as: ULTRAM   Take 50 mg by mouth every 4 (four) hours as needed. For pain              The results of significant diagnostics from this hospitalization (including imaging, microbiology, ancillary and laboratory) are listed below for reference.    Significant Diagnostic Studies: Ct Head Wo Contrast  03/17/2012  *RADIOLOGY REPORT*  Clinical Data:  Nausea, vomiting, headache  CT HEAD WITHOUT CONTRAST  Technique:  Contiguous axial images were obtained from the base of the skull through the vertex without contrast  Comparison:  None.  Findings:  The brain has a normal appearance without evidence for hemorrhage, acute infarction, hydrocephalus, or mass lesion.  There is no extra axial fluid collection.  The skull and paranasal sinuses are normal.  IMPRESSION: Normal CT of the head without contrast.   Original Report Authenticated By: Judie Petit. Ruel Favors, M.D.     Microbiology: No results found for this or any previous visit (from the past 240 hour(s)).   Labs: Basic Metabolic Panel:  Lab 03/18/12 1610 03/18/12 0330 03/17/12 2330 03/17/12 1943 03/17/12 1555  NA 130* 129* 128* 129* 127*  K 4.1 3.7 3.9 4.0 3.8  CL 95* 94* 93* 93* 92*  CO2 22 24 26 24 24   GLUCOSE 93 100* 98 103* 110*  BUN 10 11 11 11 12   CREATININE 0.84 0.95 1.01 0.78 0.80  CALCIUM 9.1 9.2 9.0 9.7  9.4  MG -- -- -- -- --  PHOS -- -- -- -- --   Liver Function Tests:  Lab 03/17/12 0702  AST 11  ALT 10  ALKPHOS 86  BILITOT 0.2*  PROT 7.3  ALBUMIN 3.8    Lab 03/17/12 0702  LIPASE 26  AMYLASE --   No results found for this basename: AMMONIA:5 in the last 168 hours CBC:  Lab 03/18/12 0323 03/17/12 0822 03/17/12 0620  WBC 3.9* -- 4.1  NEUTROABS -- -- 2.3  HGB 9.7* 11.6* 10.6*  HCT 29.7* 34.0* 31.5*  MCV 66.4* -- 65.2*  PLT 284 -- 319   Cardiac Enzymes: No results found for this basename: CKTOTAL:5,CKMB:5,CKMBINDEX:5,TROPONINI:5 in the last 168 hours BNP: BNP (last 3 results) No results found for this basename: PROBNP:3 in the last 8760 hours CBG: No results found for this basename: GLUCAP:5 in the last 168 hours  Time coordinating discharge: > 30 minutes  Signed:  Penny Pia  [facility] 03/18/2012, 8:13 AM

## 2012-03-18 NOTE — Progress Notes (Signed)
Discharge instructions accompanied pt, left the unit in stable condition. Transported via ambulance to [facility] long term facility.

## 2012-03-18 NOTE — Progress Notes (Signed)
Patient cleared for discharge. Packet copied and placed in [place]. Patient agreeable to return to [facility]. ptar called for transportation.  Alicya Bena C. Tamaka Sawin MSW, LCSW [phone]

## 2012-04-25 ENCOUNTER — Emergency Department (HOSPITAL_COMMUNITY): Payer: Medicare Other

## 2012-04-25 ENCOUNTER — Encounter (HOSPITAL_COMMUNITY): Payer: Self-pay | Admitting: *Deleted

## 2012-04-25 ENCOUNTER — Emergency Department (HOSPITAL_COMMUNITY)
Admission: EM | Admit: 2012-04-25 | Discharge: 2012-04-25 | Disposition: A | Discharge status: Home / Self Care | Payer: Medicare Other | Attending: Emergency Medicine | Admitting: Emergency Medicine

## 2012-04-25 DIAGNOSIS — I10 Essential (primary) hypertension: Secondary | ICD-10-CM | POA: Insufficient documentation

## 2012-04-25 DIAGNOSIS — Z79899 Other long term (current) drug therapy: Secondary | ICD-10-CM | POA: Insufficient documentation

## 2012-04-25 DIAGNOSIS — M1990 Unspecified osteoarthritis, unspecified site: Secondary | ICD-10-CM

## 2012-04-25 DIAGNOSIS — Z7982 Long term (current) use of aspirin: Secondary | ICD-10-CM | POA: Insufficient documentation

## 2012-04-25 DIAGNOSIS — M25559 Pain in unspecified hip: Secondary | ICD-10-CM | POA: Insufficient documentation

## 2012-04-25 DIAGNOSIS — M129 Arthropathy, unspecified: Secondary | ICD-10-CM | POA: Insufficient documentation

## 2012-04-25 DIAGNOSIS — E119 Type 2 diabetes mellitus without complications: Secondary | ICD-10-CM | POA: Insufficient documentation

## 2012-04-25 DIAGNOSIS — M79609 Pain in unspecified limb: Secondary | ICD-10-CM

## 2012-04-25 DIAGNOSIS — Z8659 Personal history of other mental and behavioral disorders: Secondary | ICD-10-CM | POA: Insufficient documentation

## 2012-04-25 HISTORY — DX: Essential (primary) hypertension: I10

## 2012-04-25 NOTE — Progress Notes (Signed)
VASCULAR LAB PRELIMINARY  PRELIMINARY  PRELIMINARY  PRELIMINARY  Left lower extremity venous duplex completed.    Preliminary report:  Left:  No evidence of DVTor superficial thrombosis. Area of mixed echoes in the popliteal fossa measuring 4.14 cm x 2.0 cm suggestive of a Baker's cyst.  Aiyanah Kalama, RVS 04/25/2012, 11:48 AM

## 2012-04-25 NOTE — ED Notes (Signed)
WUJ:[id number]<JY> Expected date:04/25/12<BR> Expected time: 9:01 AM<BR> Means of arrival:Ambulance<BR> Comments:<BR> ems

## 2012-04-25 NOTE — ED Notes (Addendum)
Per ptar reports pt is from [facility], pt c/o of pain down left leg since this am. No hx of trauma. Hx of schizophrenia.  Stroke scale negative. All motor and neuro functions intact.

## 2012-04-25 NOTE — ED Provider Notes (Signed)
History     CSN: [id number]  Arrival date & time 04/25/12  4098   First MD Initiated Contact with Patient 04/25/12 1018      Chief Complaint  Patient presents with  . Leg Pain    (Consider location/radiation/quality/duration/timing/severity/associated sxs/prior treatment) Patient is a 71 y.o. female presenting with leg pain. The history is provided by the patient and the EMS personnel. No language interpreter was used.  Leg Pain  The incident occurred at a nursing home. There was no injury mechanism.   71 year old female coming in from [facility] with complaint of left lower extremity pain. Patient is a poor historian but describes her pain in her left femur/hip area. Patient was here for the same in January and she had questionable fracture. States she is wheelchair-bound. I spoke with the staff and they say that she does walk but she was unable to walk this morning.  Past medical history listed below.  Past Medical History  Diagnosis Date  . Diabetes mellitus   . Poor historian   . Schizophrenia, schizo-affective   . Anemia   . Tachycardia   . Asthma   . Shortness of breath on exertion   . Rectal bleeding   . Cervical stenosis of spine   . Hypertension     Past Surgical History  Procedure Date  . Cesarean section     x4    No family history on file.  History  Substance Use Topics  . Smoking status: Never Smoker   . Smokeless tobacco: Never Used  . Alcohol Use: No    OB History    Grav Para Term Preterm Abortions TAB SAB Ect Mult Living                  Review of Systems  Constitutional: Negative.  Negative for fever.  HENT: Negative.  Negative for neck pain.   Eyes: Negative.   Respiratory: Negative.  Negative for shortness of breath.   Cardiovascular: Negative.  Negative for leg swelling.  Gastrointestinal: Negative.  Negative for nausea and vomiting.  Musculoskeletal: Positive for gait problem. Negative for back pain.  Skin: Negative.     Neurological: Negative.  Negative for dizziness, weakness and headaches.  Psychiatric/Behavioral: Negative.   All other systems reviewed and are negative.    Allergies  Review of patient's allergies indicates no known allergies.  Home Medications   Current Outpatient Rx  Name Route Sig Dispense Refill  . ACETAMINOPHEN 500 MG PO TABS Oral Take 1,000 mg by mouth 3 (three) times daily. Patient takes for arthritis pain.    Marland Kitchen ALPRAZOLAM 0.5 MG PO TABS Oral Take 0.5 mg by mouth 3 (three) times daily as needed. For agitation/anxiety    . AMLODIPINE BESYLATE 10 MG PO TABS Oral Take 10 mg by mouth daily.    . ASPIRIN 81 MG PO CHEW Oral Chew 81 mg by mouth daily.    Marland Kitchen BENZTROPINE MESYLATE 1 MG PO TABS Oral Take 1 mg by mouth 2 (two) times daily.    . CHLORTHALIDONE 25 MG PO TABS Oral Take 25 mg by mouth every morning.    Marland Kitchen DOCUSATE SODIUM 100 MG PO CAPS Oral Take 100 mg by mouth 2 (two) times daily.    . DONEPEZIL HCL 10 MG PO TABS Oral Take 10 mg by mouth at bedtime.    Marland Kitchen ESCITALOPRAM OXALATE 5 MG PO TABS Oral Take 5 mg by mouth daily.    Marland Kitchen FERROUS SULFATE 325 (65 FE) MG  PO TABS Oral Take 325 mg by mouth daily with breakfast. anemia    . GABAPENTIN 100 MG PO CAPS Oral Take 100 mg by mouth daily. Give daily at 3pm. Do not give with Risperidone.    Marland Kitchen HYDROCODONE-ACETAMINOPHEN 5-325 MG PO TABS Oral Take 1 tablet by mouth every 6 (six) hours as needed. For pain    . LISINOPRIL 40 MG PO TABS Oral Take 40 mg by mouth daily. hypertension    . METFORMIN HCL 500 MG PO TABS Oral Take 500 mg by mouth 2 (two) times daily with a meal.    . METOPROLOL TARTRATE 25 MG PO TABS Oral Take 25 mg by mouth 2 (two) times daily.    Marland Kitchen OMEPRAZOLE 20 MG PO CPDR Oral Take 20 mg by mouth daily.    Marland Kitchen POLYETHYLENE GLYCOL 3350 PO PACK Oral Take 17 g by mouth daily.    Marland Kitchen RISPERIDONE 3 MG PO TABS Oral Take 3 mg by mouth 2 (two) times daily.    . TOPIRAMATE 50 MG PO TABS Oral Take 50 mg by mouth 2 (two) times daily.    .  TRAMADOL HCL 50 MG PO TABS Oral Take 50 mg by mouth every 4 (four) hours as needed. For pain      BP 98/63  Pulse 86  Temp 98.6 F (37 C)  Resp 16  SpO2 100%  Physical Exam  Nursing note and vitals reviewed. Constitutional: She is oriented to person, place, and time. She appears well-developed and well-nourished.  HENT:  Head: Normocephalic and atraumatic.  Eyes: Conjunctivae normal and EOM are normal. Pupils are equal, round, and reactive to light.  Neck: Normal range of motion. Neck supple.  Cardiovascular: Normal rate.   Pulmonary/Chest: Effort normal and breath sounds normal.  Abdominal: Soft.  Musculoskeletal: Normal range of motion. She exhibits tenderness. She exhibits no edema.       L hip/femor tenderness  Neurological: She is alert and oriented to person, place, and time. She has normal reflexes.  Skin: Skin is warm and dry.  Psychiatric: She has a normal mood and affect.    ED Course  Procedures (including critical care time)  Labs Reviewed - No data to display No results found.   No diagnosis found.    MDM  From [facility] with L hip/femur pain.  L hip/femur films reviewed by myself with progressive degenerative changes with possible superolateral subluxation or the femoral head.  Ortho consulted.  Patient needs hip replacement most likely.  Will follow up with ortho this week.  Patient agrees and is ready for discharge.  Tylenol for pain.        Remi Haggard, NP 04/26/12 1910

## 2012-04-27 NOTE — ED Provider Notes (Signed)
Medical screening examination/treatment/procedure(s) were performed by non-physician practitioner and as supervising physician I was immediately available for consultation/collaboration.  Ethelda Chick, MD 04/27/12 [phone]

## 2014-02-01 ENCOUNTER — Encounter (HOSPITAL_COMMUNITY): Payer: Self-pay | Admitting: Emergency Medicine

## 2014-02-01 ENCOUNTER — Emergency Department (HOSPITAL_COMMUNITY)
Admission: EM | Admit: 2014-02-01 | Discharge: 2014-02-01 | Disposition: A | Discharge status: Home / Self Care | Payer: Medicare Other | Attending: Emergency Medicine | Admitting: Emergency Medicine

## 2014-02-01 ENCOUNTER — Emergency Department (HOSPITAL_COMMUNITY): Payer: Medicare Other

## 2014-02-01 DIAGNOSIS — I10 Essential (primary) hypertension: Secondary | ICD-10-CM | POA: Diagnosis not present

## 2014-02-01 DIAGNOSIS — M1990 Unspecified osteoarthritis, unspecified site: Secondary | ICD-10-CM

## 2014-02-01 DIAGNOSIS — M1710 Unilateral primary osteoarthritis, unspecified knee: Secondary | ICD-10-CM | POA: Diagnosis not present

## 2014-02-01 DIAGNOSIS — F0390 Unspecified dementia without behavioral disturbance: Secondary | ICD-10-CM | POA: Insufficient documentation

## 2014-02-01 DIAGNOSIS — Z8719 Personal history of other diseases of the digestive system: Secondary | ICD-10-CM | POA: Insufficient documentation

## 2014-02-01 DIAGNOSIS — M25569 Pain in unspecified knee: Secondary | ICD-10-CM | POA: Insufficient documentation

## 2014-02-01 DIAGNOSIS — E119 Type 2 diabetes mellitus without complications: Secondary | ICD-10-CM | POA: Insufficient documentation

## 2014-02-01 DIAGNOSIS — IMO0002 Reserved for concepts with insufficient information to code with codable children: Principal | ICD-10-CM

## 2014-02-01 DIAGNOSIS — Z791 Long term (current) use of non-steroidal anti-inflammatories (NSAID): Secondary | ICD-10-CM | POA: Insufficient documentation

## 2014-02-01 DIAGNOSIS — J45909 Unspecified asthma, uncomplicated: Secondary | ICD-10-CM | POA: Insufficient documentation

## 2014-02-01 DIAGNOSIS — D649 Anemia, unspecified: Secondary | ICD-10-CM | POA: Diagnosis not present

## 2014-02-01 DIAGNOSIS — Z7982 Long term (current) use of aspirin: Secondary | ICD-10-CM | POA: Diagnosis not present

## 2014-02-01 DIAGNOSIS — Z79899 Other long term (current) drug therapy: Secondary | ICD-10-CM | POA: Insufficient documentation

## 2014-02-01 MED ORDER — MELOXICAM 7.5 MG PO TABS: 7.5000 mg | ORAL_TABLET | Freq: Every day | ORAL | Status: DC

## 2014-02-01 MED ORDER — HYDROCODONE-ACETAMINOPHEN 5-325 MG PO TABS
1.0000 | ORAL_TABLET | Freq: Once | ORAL | Status: AC
  Administered 2014-02-01: 1 via ORAL
  Filled 2014-02-01: qty 1

## 2014-02-01 NOTE — ED Provider Notes (Addendum)
CSN: [id number]     Arrival date & time 02/01/14  1207 History   First MD Initiated Contact with Patient 02/01/14 1222     Chief Complaint  Patient presents with  . Leg Pain  . Knee Pain      HPI  Pt presents after complaining of left hip and right knee pain this am.  No memory of fall or injury. No reported fall or injury.  No additional complaints. H/O dementia.  Past Medical History  Diagnosis Date  . Diabetes mellitus   . Poor historian   . Schizophrenia, schizo-affective   . Anemia   . Tachycardia   . Asthma   . Shortness of breath on exertion   . Rectal bleeding   . Cervical stenosis of spine   . Hypertension    Past Surgical History  Procedure Laterality Date  . Cesarean section      x4   No family history on file. History  Substance Use Topics  . Smoking status: Never Smoker   . Smokeless tobacco: Never Used  . Alcohol Use: No   OB History   Grav Para Term Preterm Abortions TAB SAB Ect Mult Living                 Review of Systems  Unable to perform ROS: Dementia      Allergies  Review of patient's allergies indicates no known allergies.  Home Medications   Prior to Admission medications   Medication Sig Start Date End Date Taking? Authorizing Provider  acetaminophen (TYLENOL) 500 MG tablet Take 1,000 mg by mouth 3 (three) times daily. Patient takes for arthritis pain.   Yes Historical Provider, MD  ALPRAZolam Prudy Feeler) 0.5 MG tablet Take 0.5 mg by mouth 3 (three) times daily as needed. For agitation/anxiety   Yes Historical Provider, MD  aspirin 81 MG chewable tablet Chew 81 mg by mouth daily.   Yes Historical Provider, MD  benztropine (COGENTIN) 1 MG tablet Take 1 mg by mouth 2 (two) times daily.   Yes Historical Provider, MD  docusate sodium (COLACE) 100 MG capsule Take 100 mg by mouth 2 (two) times daily.   Yes Historical Provider, MD  donepezil (ARICEPT) 10 MG tablet Take 10 mg by mouth at bedtime.   Yes Historical Provider, MD  escitalopram  (LEXAPRO) 10 MG tablet Take 10 mg by mouth daily.   Yes Historical Provider, MD  ferrous sulfate 325 (65 FE) MG tablet Take 325 mg by mouth daily with breakfast. anemia   Yes Historical Provider, MD  gabapentin (NEURONTIN) 100 MG capsule Take 100 mg by mouth daily. Give daily at 3pm. Do not give with Risperidone.   Yes Historical Provider, MD  lisinopril (PRINIVIL,ZESTRIL) 10 MG tablet Take 10 mg by mouth daily.   Yes Historical Provider, MD  metFORMIN (GLUCOPHAGE) 500 MG tablet Take 500 mg by mouth 2 (two) times daily with a meal.   Yes Historical Provider, MD  metoprolol tartrate (LOPRESSOR) 25 MG tablet Take 25 mg by mouth 2 (two) times daily.   Yes Historical Provider, MD  omeprazole (PRILOSEC) 20 MG capsule Take 20 mg by mouth daily.   Yes Historical Provider, MD  polyethylene glycol (MIRALAX / GLYCOLAX) packet Take 17 g by mouth daily.   Yes Historical Provider, MD  risperiDONE (RISPERDAL) 3 MG tablet Take 3 mg by mouth 2 (two) times daily.   Yes Historical Provider, MD  topiramate (TOPAMAX) 50 MG tablet Take 50 mg by mouth 2 (two) times daily.  Yes Historical Provider, MD  vitamin C (ASCORBIC ACID) 500 MG tablet Take 500 mg by mouth 2 (two) times daily.   Yes Historical Provider, MD  meloxicam (MOBIC) 7.5 MG tablet Take 1 tablet (7.5 mg total) by mouth daily. 02/01/14   Rolland PorterMark Candance Bohlman, MD   BP 154/87  Pulse 72  Temp(Src) 98.4 F (36.9 C) (Oral)  Resp 20  SpO2 100% Physical Exam  Constitutional: She appears well-developed and well-nourished. No distress.  HENT:  Head: Normocephalic.  Eyes: Conjunctivae are normal. Pupils are equal, round, and reactive to light. No scleral icterus.  Neck: Normal range of motion. Neck supple. No thyromegaly present.  Cardiovascular: Normal rate and regular rhythm.  Exam reveals no gallop and no friction rub.   No murmur heard. Pulmonary/Chest: Effort normal and breath sounds normal. No respiratory distress. She has no wheezes. She has no rales.   Abdominal: Soft. Bowel sounds are normal. She exhibits no distension. There is no tenderness. There is no rebound.  Musculoskeletal: Normal range of motion.  Full range of motion left hip. No apparent discomfort. Range of motion of the right knee. Normal exam. No effusion.  Neurological: She is alert.  Dementia  Skin: Skin is warm and dry. No rash noted.  Psychiatric: She has a normal mood and affect. Her behavior is normal.    ED Course  Procedures (including critical care time) Labs Review Labs Reviewed - No data to display  Imaging Review Dg Pelvis 1-2 Views  02/01/2014   CLINICAL DATA:  LEFT hip pain radiating into the LEFT knee.  EXAM: PELVIS - 1-2 VIEW  COMPARISON:  11/06/2010.  FINDINGS: Since the prior exam, there has been osteolysis of the LEFT femoral head. There is also chronic remodeling of the LEFT acetabulum and superior lateral subluxation of the proximal LEFT hip. Small bone fragment is present lateral to the remnant of the LEFT femoral head. There is moderate RIGHT hip osteoarthritis with subchondral cysts. The pelvic rings are intact.  IMPRESSION: No acute osseous abnormality. Since the prior exam, osteolysis the LEFT femoral head with chronic degenerative remodeling of the LEFT hip joint. New lateral subluxation of the LEFT hip. The differential considerations are chronic septic arthritis, neuropathic joint or AVN with severe degenerative changes.   Electronically Signed   By: Andreas NewportGeoffrey  Lamke M.D.   On: 02/01/2014 13:49   Dg Hip Complete Left  02/01/2014   CLINICAL DATA:  Leg pain  EXAM: LEFT HIP - COMPLETE 2+ VIEW  COMPARISON:  04/25/2012  FINDINGS: Severe hip dysplasia with erosion of the femoral head and shallow acetabulum. Superior subluxation of the femoral head. There is joint space narrowing and spurring similar to the prior study. Negative for acute fracture.  IMPRESSION: Left hip dysplasia with severe osteoarthritis. No acute abnormality and no change from the prior  exam.   Electronically Signed   By: Marlan Palauharles  Clark M.D.   On: 02/01/2014 13:30   Dg Knee Complete 4 Views Left  02/01/2014   CLINICAL DATA:  Pain.  EXAM: LEFT KNEE - COMPLETE 4+ VIEW  COMPARISON:  04/25/2012.  FINDINGS: Diffuse severe osteopenia is present. Severe tricompartment degenerative change. Chondrocalcinosis is present, this is most likely degenerative. Loose bodies again noted. Calcifications in the medial suprapatellar region could represent loose bodies. These could represent musculotendinous dystrophic calcifications in the distal quadriceps, possibly from old trauma. No evidence of acute fracture or dislocation .  IMPRESSION: 1. Chronic changes as described above. 2. No acute abnormality identified. No evidence of acute  fracture or dislocation. Exam stable from 04/25/2012.   Electronically Signed   By: Maisie Fus  Register   On: 02/01/2014 13:37     EKG Interpretation None      MDM   Final diagnoses:  Arthritis    No injury.  Normal movement supine. History of arthritis.  Able to ambulate by EMS.  14:51:  X-ray show no change versus comparison. She demonstrates full range of motion. Is able to stand at the bedside without apparent discomfort. States "arthritis was just bad today". Plan will be a five-day low-dose anti-inflammatory. Primary care followup for continuation.    Rolland Porter, MD 02/01/14 1301  Rolland Porter, MD 02/01/14 1431  Rolland Porter, MD 02/01/14 [phone]

## 2014-02-01 NOTE — ED Notes (Signed)
Per PTAR, pt from [facility]. Woke up this morning and tried to get out of bed but couldn't stand on right leg due to pain. Pt states she has arthritis. Per PTAR, pt stood up and pivoted from wheelchair to stretcher. Pt has full ROM in all extremities. Denies injury. AAOx4

## 2014-02-01 NOTE — ED Notes (Signed)
PTAR here  ?

## 2014-02-01 NOTE — Discharge Instructions (Signed)
Arthritis, Nonspecific °Arthritis is inflammation of a joint. This usually means pain, redness, warmth or swelling are present. One or more joints may be involved. There are a number of types of arthritis. Your caregiver may not be able to tell what type of arthritis you have right away. °CAUSES  °The most common cause of arthritis is the wear and tear on the joint (osteoarthritis). This causes damage to the cartilage, which can break down over time. The knees, hips, back and neck are most often affected by this type of arthritis. °Other types of arthritis and common causes of joint pain include: °· Sprains and other injuries near the joint. Sometimes minor sprains and injuries cause pain and swelling that develop hours later. °· Rheumatoid arthritis. This affects hands, feet and knees. It usually affects both sides of your body at the same time. It is often associated with chronic ailments, fever, weight loss and general weakness. °· Crystal arthritis. Gout and pseudo gout can cause occasional acute severe pain, redness and swelling in the foot, ankle, or knee. °· Infectious arthritis. Bacteria can get into a joint through a break in overlying skin. This can cause infection of the joint. Bacteria and viruses can also spread through the blood and affect your joints. °· Drug, infectious and allergy reactions. Sometimes joints can become mildly painful and slightly swollen with these types of illnesses. °SYMPTOMS  °· Pain is the main symptom. °· Your joint or joints can also be red, swollen and warm or hot to the touch. °· You may have a fever with certain types of arthritis, or even feel overall ill. °· The joint with arthritis will hurt with movement. Stiffness is present with some types of arthritis. °DIAGNOSIS  °Your caregiver will suspect arthritis based on your description of your symptoms and on your exam. Testing may be needed to find the type of arthritis: °· Blood and sometimes urine tests. °· X-ray tests  and sometimes CT or MRI scans. °· Removal of fluid from the joint (arthrocentesis) is done to check for bacteria, crystals or other causes. Your caregiver (or a specialist) will numb the area over the joint with a local anesthetic, and use a needle to remove joint fluid for examination. This procedure is only minimally uncomfortable. °· Even with these tests, your caregiver may not be able to tell what kind of arthritis you have. Consultation with a specialist (rheumatologist) may be helpful. °TREATMENT  °Your caregiver will discuss with you treatment specific to your type of arthritis. If the specific type cannot be determined, then the following general recommendations may apply. °Treatment of severe joint pain includes: °· Rest. °· Elevation. °· Anti-inflammatory medication (for example, ibuprofen) may be prescribed. Avoiding activities that cause increased pain. °· Only take over-the-counter or prescription medicines for pain and discomfort as recommended by your caregiver. °· Cold packs over an inflamed joint may be used for 10 to 15 minutes every hour. Hot packs sometimes feel better, but do not use overnight. Do not use hot packs if you are diabetic without your caregiver's permission. °· A cortisone shot into arthritic joints may help reduce pain and swelling. °· Any acute arthritis that gets worse over the next 1 to 2 days needs to be looked at to be sure there is no joint infection. °Long-term arthritis treatment involves modifying activities and lifestyle to reduce joint stress jarring. This can include weight loss. Also, exercise is needed to nourish the joint cartilage and remove waste. This helps keep the muscles   around the joint strong. °HOME CARE INSTRUCTIONS  °· Do not take aspirin to relieve pain if gout is suspected. This elevates uric acid levels. °· Only take over-the-counter or prescription medicines for pain, discomfort or fever as directed by your caregiver. °· Rest the joint as much as  possible. °· If your joint is swollen, keep it elevated. °· Use crutches if the painful joint is in your leg. °· Drinking plenty of fluids may help for certain types of arthritis. °· Follow your caregiver's dietary instructions. °· Try low-impact exercise such as: °¨ Swimming. °¨ Water aerobics. °¨ Biking. °¨ Walking. °· Morning stiffness is often relieved by a warm shower. °· Put your joints through regular range-of-motion. °SEEK MEDICAL CARE IF:  °· You do not feel better in 24 hours or are getting worse. °· You have side effects to medications, or are not getting better with treatment. °SEEK IMMEDIATE MEDICAL CARE IF:  °· You have a fever. °· You develop severe joint pain, swelling or redness. °· Many joints are involved and become painful and swollen. °· There is severe back pain and/or leg weakness. °· You have loss of bowel or bladder control. °Document Released: 08/06/2004 Document Revised: 09/21/2011 Document Reviewed: 08/22/2008 °Exit[employer]® Patient Information ©2015 [employer]. This information is not intended to replace advice given to you by your health care provider. Make sure you discuss any questions you have with your health care provider. ° °

## 2014-02-01 NOTE — ED Notes (Signed)
Patient aware of results and plan of care. Pt ready for discharge. Secretary notified to call [facility]. Pt using bed pan at present time.

## 2014-03-08 ENCOUNTER — Emergency Department (HOSPITAL_COMMUNITY)
Admission: EM | Admit: 2014-03-08 | Discharge: 2014-03-09 | Disposition: A | Discharge status: Home / Self Care | Payer: Medicare Other | Attending: Emergency Medicine | Admitting: Emergency Medicine

## 2014-03-08 ENCOUNTER — Emergency Department (HOSPITAL_COMMUNITY): Payer: Medicare Other

## 2014-03-08 ENCOUNTER — Encounter (HOSPITAL_COMMUNITY): Payer: Self-pay | Admitting: Emergency Medicine

## 2014-03-08 DIAGNOSIS — J45909 Unspecified asthma, uncomplicated: Secondary | ICD-10-CM | POA: Insufficient documentation

## 2014-03-08 DIAGNOSIS — F259 Schizoaffective disorder, unspecified: Secondary | ICD-10-CM | POA: Insufficient documentation

## 2014-03-08 DIAGNOSIS — D649 Anemia, unspecified: Secondary | ICD-10-CM | POA: Diagnosis not present

## 2014-03-08 DIAGNOSIS — I10 Essential (primary) hypertension: Secondary | ICD-10-CM | POA: Insufficient documentation

## 2014-03-08 DIAGNOSIS — R51 Headache: Secondary | ICD-10-CM | POA: Insufficient documentation

## 2014-03-08 DIAGNOSIS — Z8739 Personal history of other diseases of the musculoskeletal system and connective tissue: Secondary | ICD-10-CM | POA: Diagnosis not present

## 2014-03-08 DIAGNOSIS — R519 Headache, unspecified: Secondary | ICD-10-CM

## 2014-03-08 DIAGNOSIS — E119 Type 2 diabetes mellitus without complications: Secondary | ICD-10-CM | POA: Insufficient documentation

## 2014-03-08 DIAGNOSIS — Z79899 Other long term (current) drug therapy: Secondary | ICD-10-CM | POA: Diagnosis not present

## 2014-03-08 DIAGNOSIS — Z8719 Personal history of other diseases of the digestive system: Secondary | ICD-10-CM | POA: Insufficient documentation

## 2014-03-08 DIAGNOSIS — F0280 Dementia in other diseases classified elsewhere without behavioral disturbance: Secondary | ICD-10-CM | POA: Diagnosis not present

## 2014-03-08 DIAGNOSIS — G309 Alzheimer's disease, unspecified: Secondary | ICD-10-CM | POA: Insufficient documentation

## 2014-03-08 MED ORDER — ACETAMINOPHEN 325 MG PO TABS
650.0000 mg | ORAL_TABLET | Freq: Once | ORAL | Status: AC
  Administered 2014-03-08: 650 mg via ORAL
  Filled 2014-03-08: qty 2

## 2014-03-08 NOTE — ED Notes (Signed)
Pt reports h/a x 2 days with photosensitivity.  Pt reports feeling "quizzy" in her stomach but denies n/v.  Pt is A&O  Per norm.  No obvious neuro deficits noted at this time.

## 2014-03-08 NOTE — ED Notes (Signed)
Per EMS, pt from [facility], c/o h/a with photosensitivity.  Pt has hx of schizophrenia.

## 2014-03-08 NOTE — ED Notes (Signed)
Bed: NG29 Expected date: 03/08/14 Expected time: 7:10 PM Means of arrival: Ambulance Comments: 73 yr old headache

## 2014-03-08 NOTE — ED Provider Notes (Signed)
CSN: [id number]     Arrival date & time 03/08/14  1935 History   First MD Initiated Contact with Patient 03/08/14 2030     Chief Complaint  Patient presents with  . Headache     (Consider location/radiation/quality/duration/timing/severity/associated sxs/prior Treatment) Patient is a 73 y.o. female presenting with headaches. The history is provided by the patient and medical records.  Headache  LEVEL 5 CAVEAT: DEMENTIA This is a 74 year old female with PMH significant for HTN, DM, schizophrenia, asthma, anemia, presenting to the ED from [facility] for headache.  Patient states headache started today around noon after eating her lunch.  States pain is localized to the back of her head, associated with photophobia and nausea.  Denies dizziness, lightheadedness, visual disturbance, changes in speech, confusion, tinnitus, or vomiting.  Denies recent injuries, head trauma, or falls.  Patient states she was given some tylenol around 1400 which improved her headache somewhat but she still feels that it is severe.  Patient has no prior hx of migraines.  No hx of stroke or TIA.  Denies fever, sweats, chills, or neck pain.  States aside from headache she feels at her baseline.  Patient is on aspirin, no other anticoagulants.  Past Medical History  Diagnosis Date  . Diabetes mellitus   . Poor historian   . Schizophrenia, schizo-affective   . Anemia   . Tachycardia   . Asthma   . Shortness of breath on exertion   . Rectal bleeding   . Cervical stenosis of spine   . Hypertension    Past Surgical History  Procedure Laterality Date  . Cesarean section      x4   No family history on file. History  Substance Use Topics  . Smoking status: Never Smoker   . Smokeless tobacco: Never Used  . Alcohol Use: No   OB History   Grav Para Term Preterm Abortions TAB SAB Ect Mult Living                 Review of Systems  Unable to perform ROS: Dementia  All other systems reviewed and are  negative.     Allergies  Review of patient's allergies indicates no known allergies.  Home Medications   Prior to Admission medications   Medication Sig Start Date End Date Taking? Authorizing Provider  acetaminophen (TYLENOL) 500 MG tablet Take 1,000 mg by mouth 3 (three) times daily. Patient takes for arthritis pain.   Yes Historical Provider, MD  ALPRAZolam Prudy Feeler) 0.5 MG tablet Take 0.5 mg by mouth 3 (three) times daily as needed. For agitation/anxiety   Yes Historical Provider, MD  aspirin 81 MG chewable tablet Chew 81 mg by mouth daily.   Yes Historical Provider, MD  benztropine (COGENTIN) 1 MG tablet Take 1 mg by mouth 2 (two) times daily.   Yes Historical Provider, MD  docusate sodium (COLACE) 100 MG capsule Take 100 mg by mouth 2 (two) times daily.   Yes Historical Provider, MD  donepezil (ARICEPT) 10 MG tablet Take 10 mg by mouth at bedtime.   Yes Historical Provider, MD  escitalopram (LEXAPRO) 10 MG tablet Take 10 mg by mouth daily.   Yes Historical Provider, MD  ferrous sulfate 325 (65 FE) MG tablet Take 325 mg by mouth 2 (two) times daily with a meal. anemia   Yes Historical Provider, MD  gabapentin (NEURONTIN) 100 MG capsule Take 100 mg by mouth daily. Give daily at 3pm. Do not give with Risperidone.   Yes Historical Provider,  MD  lisinopril (PRINIVIL,ZESTRIL) 10 MG tablet Take 10 mg by mouth daily.   Yes Historical Provider, MD  metFORMIN (GLUCOPHAGE) 500 MG tablet Take 500 mg by mouth 2 (two) times daily with a meal.   Yes Historical Provider, MD  metoprolol tartrate (LOPRESSOR) 25 MG tablet Take 25 mg by mouth 2 (two) times daily.   Yes Historical Provider, MD  omeprazole (PRILOSEC) 20 MG capsule Take 20 mg by mouth daily.   Yes Historical Provider, MD  polyethylene glycol (MIRALAX / GLYCOLAX) packet Take 17 g by mouth daily.   Yes Historical Provider, MD  risperiDONE (RISPERDAL) 3 MG tablet Take 3 mg by mouth 2 (two) times daily.   Yes Historical Provider, MD  topiramate  (TOPAMAX) 50 MG tablet Take 50 mg by mouth 2 (two) times daily.   Yes Historical Provider, MD  vitamin C (ASCORBIC ACID) 500 MG tablet Take 500 mg by mouth 2 (two) times daily.   Yes Historical Provider, MD   BP 138/71  Pulse 99  Temp(Src) 98.7 F (37.1 C) (Oral)  Resp 20  SpO2 100%  Physical Exam  Nursing note and vitals reviewed. Constitutional: She is oriented to person, place, and time. She appears well-developed and well-nourished. No distress.  HENT:  Head: Normocephalic and atraumatic.  Mouth/Throat: Oropharynx is clear and moist.  Eyes: Conjunctivae and EOM are normal. Pupils are equal, round, and reactive to light.  Neck: Normal range of motion and full passive range of motion without pain. Neck supple. No muscular tenderness present. No rigidity.  No meningeal signs  Cardiovascular: Normal rate, regular rhythm and normal heart sounds.   Pulmonary/Chest: Effort normal and breath sounds normal. No respiratory distress. She has no wheezes.  Abdominal: Soft. Bowel sounds are normal. There is no tenderness. There is no guarding.  Musculoskeletal: Normal range of motion. She exhibits no edema.  Neurological: She is alert and oriented to person, place, and time.  AAOx3, answering questions appropriately; equal strength UE and LE bilaterally; CN grossly intact; moves all extremities appropriately without ataxia; no focal neuro deficits or facial asymmetry appreciated  Skin: Skin is warm and dry. She is not diaphoretic.  Psychiatric: She has a normal mood and affect.    ED Course  Procedures (including critical care time) Labs Review Labs Reviewed - No data to display  Imaging Review Ct Head Wo Contrast  03/08/2014   CLINICAL DATA:  Two day history of headache with photosensitivity  EXAM: CT HEAD WITHOUT CONTRAST  TECHNIQUE: Contiguous axial images were obtained from the base of the skull through the vertex without intravenous contrast.  COMPARISON:  March 17, 2012  FINDINGS:  The ventricles are normal in size and configuration. There is no mass, hemorrhage, extra-axial fluid collection, or midline shift. Gray-white compartments appear normal. There is no demonstrable acute infarct. Bony calvarium appears intact. The mastoid air cells are clear.  IMPRESSION: Study within normal limits. No focal gray -white compartment lesion. No hemorrhage or mass effect.   Electronically Signed   By: Bretta Bang M.D.   On: 03/08/2014 21:39     EKG Interpretation None      MDM   Final diagnoses:  Headache, unspecified headache type   73 year old demented female presenting to the ED from [facility] for headache.  States improved earlier today with tylenol but has since returned.  Neuro exam non-focal.  No fever or nuchal rigidity to suggest meningitis.  Will obtain CT head.  Additional tylenol given.  CT head negative for  acute findings.  After Tylenol, patient states her headache is better. She was monitored for a while longer and is currently sleeping comfortably in bed.  When awoken, states she is feeling better.  Will discharge back to her nursing facility. She'll follow with her primary care physician. Continue Tylenol Motrin as needed for headache.  Discussed plan with patient, he/she acknowledged understanding and agreed with plan of care.  Return precautions given for new or worsening symptoms.  [facility] called for transport.  Case discussed with attending physician, Dr. Rubin Payor, who personally evaluated patient and agrees with plan of care.  Garlon Hatchet, PA-C 03/09/14 0020

## 2014-03-09 NOTE — Discharge Instructions (Signed)
Continue tylenol/motrin as needed for headache. Follow-up with primary care physician. Return to the ED for new concerns.

## 2014-03-09 NOTE — ED Notes (Signed)
Report called to [facility], spoke to [facility] med. Tech.  She's also made aware that pt is reporting that she is under a lot of stress there and is requesting to see a SW.  Candace states that they are aware of this and will make a note re pt wanting to see a SW.

## 2014-03-09 NOTE — ED Notes (Signed)
[facility] contacted for transport back to [facility] 

## 2014-03-10 NOTE — ED Provider Notes (Signed)
Medical screening examination/treatment/procedure(s) were performed by non-physician practitioner and as supervising physician I was immediately available for consultation/collaboration.   EKG Interpretation None       Deberah Adolf R. Palyn Scrima, MD 03/10/14 0720 

## 2014-08-28 ENCOUNTER — Emergency Department (HOSPITAL_COMMUNITY)
Admission: EM | Admit: 2014-08-28 | Discharge: 2014-08-28 | Disposition: A | Discharge status: Home / Self Care | Payer: Medicare Other | Attending: Emergency Medicine | Admitting: Emergency Medicine

## 2014-08-28 ENCOUNTER — Encounter (HOSPITAL_COMMUNITY): Payer: Self-pay

## 2014-08-28 DIAGNOSIS — Z8719 Personal history of other diseases of the digestive system: Secondary | ICD-10-CM | POA: Diagnosis not present

## 2014-08-28 DIAGNOSIS — Z7982 Long term (current) use of aspirin: Secondary | ICD-10-CM | POA: Insufficient documentation

## 2014-08-28 DIAGNOSIS — R110 Nausea: Secondary | ICD-10-CM | POA: Insufficient documentation

## 2014-08-28 DIAGNOSIS — Z8739 Personal history of other diseases of the musculoskeletal system and connective tissue: Secondary | ICD-10-CM | POA: Diagnosis not present

## 2014-08-28 DIAGNOSIS — E119 Type 2 diabetes mellitus without complications: Secondary | ICD-10-CM | POA: Insufficient documentation

## 2014-08-28 DIAGNOSIS — J45909 Unspecified asthma, uncomplicated: Secondary | ICD-10-CM | POA: Insufficient documentation

## 2014-08-28 DIAGNOSIS — I10 Essential (primary) hypertension: Secondary | ICD-10-CM | POA: Diagnosis not present

## 2014-08-28 DIAGNOSIS — N898 Other specified noninflammatory disorders of vagina: Secondary | ICD-10-CM | POA: Diagnosis not present

## 2014-08-28 DIAGNOSIS — R350 Frequency of micturition: Secondary | ICD-10-CM | POA: Diagnosis present

## 2014-08-28 DIAGNOSIS — L293 Anogenital pruritus, unspecified: Secondary | ICD-10-CM | POA: Insufficient documentation

## 2014-08-28 DIAGNOSIS — F259 Schizoaffective disorder, unspecified: Secondary | ICD-10-CM | POA: Insufficient documentation

## 2014-08-28 DIAGNOSIS — D649 Anemia, unspecified: Secondary | ICD-10-CM | POA: Insufficient documentation

## 2014-08-28 DIAGNOSIS — Z79899 Other long term (current) drug therapy: Secondary | ICD-10-CM | POA: Diagnosis not present

## 2014-08-28 LAB — WET PREP, GENITAL
TRICH WET PREP: NONE SEEN
Yeast Wet Prep HPF POC: NONE SEEN

## 2014-08-28 LAB — CBC WITH DIFFERENTIAL/PLATELET
BASOS ABS: 0 10*3/uL (ref 0.0–0.1)
Basophils Relative: 1 % (ref 0–1)
EOS PCT: 4 % (ref 0–5)
Eosinophils Absolute: 0.2 10*3/uL (ref 0.0–0.7)
HEMATOCRIT: 37.1 % (ref 36.0–46.0)
HEMOGLOBIN: 11.5 g/dL — AB (ref 12.0–15.0)
LYMPHS PCT: 50 % — AB (ref 12–46)
Lymphs Abs: 2.4 10*3/uL (ref 0.7–4.0)
MCH: 22.4 pg — ABNORMAL LOW (ref 26.0–34.0)
MCHC: 31 g/dL (ref 30.0–36.0)
MCV: 72.3 fL — AB (ref 78.0–100.0)
MONO ABS: 0.3 10*3/uL (ref 0.1–1.0)
MONOS PCT: 7 % (ref 3–12)
Neutro Abs: 1.8 10*3/uL (ref 1.7–7.7)
Neutrophils Relative %: 38 % — ABNORMAL LOW (ref 43–77)
Platelets: 228 10*3/uL (ref 150–400)
RBC: 5.13 MIL/uL — ABNORMAL HIGH (ref 3.87–5.11)
RDW: 15 % (ref 11.5–15.5)
WBC: 4.7 10*3/uL (ref 4.0–10.5)

## 2014-08-28 LAB — URINALYSIS, ROUTINE W REFLEX MICROSCOPIC
Bilirubin Urine: NEGATIVE
GLUCOSE, UA: NEGATIVE mg/dL
Hgb urine dipstick: NEGATIVE
KETONES UR: NEGATIVE mg/dL
Nitrite: NEGATIVE
PH: 6 (ref 5.0–8.0)
PROTEIN: NEGATIVE mg/dL
Specific Gravity, Urine: 1.004 — ABNORMAL LOW (ref 1.005–1.030)
Urobilinogen, UA: 0.2 mg/dL (ref 0.0–1.0)

## 2014-08-28 LAB — COMPREHENSIVE METABOLIC PANEL
ALT: 16 U/L (ref 0–35)
ANION GAP: 10 (ref 5–15)
AST: 19 U/L (ref 0–37)
Albumin: 4.3 g/dL (ref 3.5–5.2)
Alkaline Phosphatase: 98 U/L (ref 39–117)
BILIRUBIN TOTAL: 0.5 mg/dL (ref 0.3–1.2)
BUN: 17 mg/dL (ref 6–23)
CHLORIDE: 100 mmol/L (ref 96–112)
CO2: 24 mmol/L (ref 19–32)
CREATININE: 0.85 mg/dL (ref 0.50–1.10)
Calcium: 9.1 mg/dL (ref 8.4–10.5)
GFR, EST AFRICAN AMERICAN: 78 mL/min — AB (ref >90)
GFR, EST NON AFRICAN AMERICAN: 67 mL/min — AB (ref >90)
GLUCOSE: 104 mg/dL — AB (ref 70–99)
Potassium: 4 mmol/L (ref 3.5–5.1)
Sodium: 134 mmol/L — ABNORMAL LOW (ref 135–145)
Total Protein: 8.2 g/dL (ref 6.0–8.3)

## 2014-08-28 LAB — URINE MICROSCOPIC-ADD ON

## 2014-08-28 LAB — LIPASE, BLOOD: Lipase: 36 U/L (ref 11–59)

## 2014-08-28 LAB — CBG MONITORING, ED: GLUCOSE-CAPILLARY: 97 mg/dL (ref 70–99)

## 2014-08-28 NOTE — ED Notes (Signed)
3 attempt to try to get labs drawn with no success; another RN and lab at bedside to attempt.

## 2014-08-28 NOTE — ED Notes (Signed)
Per EMS: [facility] c/o bladder discomfort with an achy description and increased urine frequency for the last month. Pt denies any pain to abdomen or n/v.

## 2014-08-28 NOTE — ED Notes (Signed)
Bed: [id number] Expected date: 08/27/14 Expected time: 11:56 PM Means of arrival: Ambulance Comments: Urinary frequency

## 2014-08-28 NOTE — ED Notes (Signed)
[facility] at bedside to transport pt  

## 2014-08-28 NOTE — Discharge Instructions (Signed)

## 2014-08-28 NOTE — ED Provider Notes (Signed)
CSN: [id number]     Arrival date & time 08/28/14  0003 History   First MD Initiated Contact with Patient 08/28/14 0111     Chief Complaint  Patient presents with  . Urinary Frequency    (Consider location/radiation/quality/duration/timing/severity/associated sxs/prior Treatment) HPI Comments: Patient is a 74 year old female with a history of diabetes mellitus, schizoaffective schizophrenia, and hypertension. Patient presents to the emergency department from [facility]. Patient complaining of urinary frequency and vaginal irritation. She describes the irritation as a "throbbing" pain which is intermittent. She states that she has been having this pain for "quite some time". Triage note references a duration of one month, approximately. Patient states, "I feel like I am going to throw up every time I urinate." She describes a pressure-like sensation while waiting. Patient expresses that she feels as though her symptoms may be secondary to vaginal douching every other week. She reports no associated fever, vaginal bleeding, vomiting, or hematuria. Abdominal surgical hx significant for cesarean section.  As patient was complaining of chronic-like symptoms, [facility] was contacted to inquire about the circumstances surrounding a visit to the ED at this hour. Caregiver for the patient relays that patient complained of abdominal pain and felt as though her "gallbladder was going to explode". Patient expressed no such complaints during my encounter with her. Patient does have a history of being a very poor historian.  Patient is a 74 y.o. female presenting with frequency. The history is provided by the patient. No language interpreter was used.  Urinary Frequency Associated symptoms include abdominal pain and nausea. Pertinent negatives include no fever or vomiting.    Past Medical History  Diagnosis Date  . Diabetes mellitus   . Poor historian   . Schizophrenia, schizo-affective   . Anemia   .  Tachycardia   . Asthma   . Shortness of breath on exertion   . Rectal bleeding   . Cervical stenosis of spine   . Hypertension    Past Surgical History  Procedure Laterality Date  . Cesarean section      x4   History reviewed. No pertinent family history. History  Substance Use Topics  . Smoking status: Never Smoker   . Smokeless tobacco: Never Used  . Alcohol Use: No   OB History    No data available      Review of Systems  Constitutional: Negative for fever.  Gastrointestinal: Positive for nausea and abdominal pain. Negative for vomiting.  Genitourinary: Positive for dysuria, frequency and vaginal pain. Negative for decreased urine volume.  Neurological: Negative for syncope.  All other systems reviewed and are negative.   Allergies  Review of patient's allergies indicates no known allergies.  Home Medications   Prior to Admission medications   Medication Sig Start Date End Date Taking? Authorizing Provider  acetaminophen (TYLENOL) 500 MG tablet Take 1,000 mg by mouth 3 (three) times daily. Patient takes for arthritis pain.   Yes Historical Provider, MD  ALPRAZolam Prudy Feeler(XANAX) 0.5 MG tablet Take 0.5 mg by mouth 3 (three) times daily as needed. For agitation/anxiety   Yes Historical Provider, MD  aspirin 81 MG chewable tablet Chew 81 mg by mouth daily.   Yes Historical Provider, MD  benztropine (COGENTIN) 1 MG tablet Take 1 mg by mouth 2 (two) times daily.   Yes Historical Provider, MD  docusate sodium (COLACE) 100 MG capsule Take 100 mg by mouth 2 (two) times daily.   Yes Historical Provider, MD  donepezil (ARICEPT) 10 MG tablet Take 10  mg by mouth at bedtime.   Yes Historical Provider, MD  escitalopram (LEXAPRO) 10 MG tablet Take 10 mg by mouth daily.   Yes Historical Provider, MD  ferrous sulfate 325 (65 FE) MG tablet Take 325 mg by mouth 2 (two) times daily. anemia   Yes Historical Provider, MD  gabapentin (NEURONTIN) 100 MG capsule Take 100 mg by mouth daily. Give  daily at 3pm. Do not give with Risperidone.   Yes Historical Provider, MD  lisinopril (PRINIVIL,ZESTRIL) 10 MG tablet Take 10 mg by mouth daily.   Yes Historical Provider, MD  metFORMIN (GLUCOPHAGE) 500 MG tablet Take 500 mg by mouth 2 (two) times daily with a meal.   Yes Historical Provider, MD  metoprolol tartrate (LOPRESSOR) 25 MG tablet Take 25 mg by mouth 2 (two) times daily.   Yes Historical Provider, MD  omeprazole (PRILOSEC) 20 MG capsule Take 20 mg by mouth daily.   Yes Historical Provider, MD  polyethylene glycol (MIRALAX / GLYCOLAX) packet Take 17 g by mouth daily.   Yes Historical Provider, MD  risperiDONE (RISPERDAL) 3 MG tablet Take 3 mg by mouth 2 (two) times daily.   Yes Historical Provider, MD  topiramate (TOPAMAX) 50 MG tablet Take 50 mg by mouth 2 (two) times daily.   Yes Historical Provider, MD  vitamin C (ASCORBIC ACID) 500 MG tablet Take 500 mg by mouth 2 (two) times daily.   Yes Historical Provider, MD   BP 122/59 mmHg  Pulse 74  Temp(Src) 98 F (36.7 C) (Oral)  Resp 20  SpO2 100%   Physical Exam  Constitutional: She is oriented to person, place, and time. She appears well-developed and well-nourished. No distress.  Nontoxic/nonseptic appearing  HENT:  Head: Normocephalic and atraumatic.  Eyes: Conjunctivae and EOM are normal. No scleral icterus.  Neck: Normal range of motion.  Cardiovascular: Normal rate, regular rhythm and intact distal pulses.   Pulmonary/Chest: Effort normal. No respiratory distress. She has no wheezes. She has no rales.  Respirations even and unlabored  Abdominal: Soft. There is tenderness. There is no rebound and no guarding.  Soft obese abdomen with mild, diffuse tenderness to deep palpation. No peritoneal signs or guarding. No masses. Exam is limited secondary to body habitus. Patient noted to have a suprapubic, vertical incision site which she states is from her prior C-section.  Genitourinary: There is no rash, tenderness, lesion or  injury on the right labia. There is no rash, tenderness, lesion or injury on the left labia. Uterus is not tender. Cervix exhibits no motion tenderness and no friability. Right adnexum displays no mass, no tenderness and no fullness. Left adnexum displays no mass, no tenderness and no fullness. Vaginal discharge (scant, white, creamy d/c) found.  Musculoskeletal: Normal range of motion.  Neurological: She is alert and oriented to person, place, and time. She exhibits normal muscle tone. Coordination normal.  GCS 15. Speech is goal oriented. Patient moves extremities without ataxia. She answers questions appropriately and follows simple commands.  Skin: Skin is warm and dry. No rash noted. She is not diaphoretic. No erythema. No pallor.  Psychiatric: She has a normal mood and affect. Her behavior is normal.  Nursing note and vitals reviewed.   ED Course  Procedures (including critical care time) Labs Review Labs Reviewed  WET PREP, GENITAL - Abnormal; Notable for the following:    Clue Cells Wet Prep HPF POC RARE (*)    WBC, Wet Prep HPF POC RARE (*)    All other components  within normal limits  URINALYSIS, ROUTINE W REFLEX MICROSCOPIC - Abnormal; Notable for the following:    Specific Gravity, Urine 1.004 (*)    Leukocytes, UA TRACE (*)    All other components within normal limits  CBC WITH DIFFERENTIAL/PLATELET - Abnormal; Notable for the following:    RBC 5.13 (*)    Hemoglobin 11.5 (*)    MCV 72.3 (*)    MCH 22.4 (*)    Neutrophils Relative % 38 (*)    Lymphocytes Relative 50 (*)    All other components within normal limits  COMPREHENSIVE METABOLIC PANEL - Abnormal; Notable for the following:    Sodium 134 (*)    Glucose, Bld 104 (*)    GFR calc non Af Amer 67 (*)    GFR calc Af Amer 78 (*)    All other components within normal limits  URINE MICROSCOPIC-ADD ON  LIPASE, BLOOD  CBG MONITORING, ED  GC/CHLAMYDIA PROBE AMP ([place])    Imaging Review No results found.    EKG Interpretation None      MDM   Final diagnoses:  Vaginal irritation  Urinary frequency    74 year old female presents to the emergency department from [facility]. She is an extremely poor historian who complains primarily of urinary frequency and vaginal discomfort for "some time". Per [employer], she had fleeting complaints of abdominal pain, but expresses no complaints of this while in the ED.   Patient is afebrile and hemodynamically stable. Her labs are consistent with baseline. CBG stable at 97. Lipase WNL. Urinalysis does not suggest infection. Pelvic exam also completed. Patient had no cervical motion tenderness or adnexal tenderness. Wet prep unremarkable today.  Given patient's reassuring workup, do not believe further emergent testing or imaging is indicated at this time. Patient has no focal abdominal tenderness. This remains stable on reexamination. Patient is appropriate for discharge with instruction to follow-up with her primary care doctor for further evaluation of her frequency and vaginal discomfort. Return precautions provided. Patient agreeable to plan with no unaddressed concerns. Patient seen also by my attending, Dr. Mora Bellman, who is in agreement with this workup, assessment, management plan, and patient's stability for discharge.   Filed Vitals:   08/28/14 0006 08/28/14 0340  BP: 127/67 122/59  Pulse: 67 74  Temp: 97.9 F (36.6 C) 98 F (36.7 C)  TempSrc: Oral Oral  Resp: 22 20  SpO2: 100% 100%     Antony Madura, PA-C 08/28/14 1610  Tomasita Crumble, MD 08/28/14 1539

## 2014-08-29 LAB — GC/CHLAMYDIA PROBE AMP (~~LOC~~) NOT AT ARMC
Chlamydia: NEGATIVE
Neisseria Gonorrhea: NEGATIVE

## 2015-05-16 ENCOUNTER — Observation Stay (HOSPITAL_COMMUNITY)
Admission: EM | Admit: 2015-05-16 | Discharge: 2015-05-17 | Disposition: A | Discharge status: Skilled Nursing Facility | Payer: Medicare Other | Attending: Internal Medicine | Admitting: Internal Medicine

## 2015-05-16 ENCOUNTER — Emergency Department (HOSPITAL_COMMUNITY): Payer: Medicare Other

## 2015-05-16 ENCOUNTER — Encounter (HOSPITAL_COMMUNITY): Payer: Self-pay | Admitting: Emergency Medicine

## 2015-05-16 DIAGNOSIS — Z7982 Long term (current) use of aspirin: Secondary | ICD-10-CM | POA: Insufficient documentation

## 2015-05-16 DIAGNOSIS — I10 Essential (primary) hypertension: Secondary | ICD-10-CM | POA: Diagnosis not present

## 2015-05-16 DIAGNOSIS — J45909 Unspecified asthma, uncomplicated: Secondary | ICD-10-CM | POA: Diagnosis not present

## 2015-05-16 DIAGNOSIS — G8929 Other chronic pain: Secondary | ICD-10-CM | POA: Diagnosis not present

## 2015-05-16 DIAGNOSIS — M24852 Other specific joint derangements of left hip, not elsewhere classified: Secondary | ICD-10-CM | POA: Insufficient documentation

## 2015-05-16 DIAGNOSIS — F209 Schizophrenia, unspecified: Secondary | ICD-10-CM | POA: Diagnosis not present

## 2015-05-16 DIAGNOSIS — M21852 Other specified acquired deformities of left thigh: Secondary | ICD-10-CM | POA: Insufficient documentation

## 2015-05-16 DIAGNOSIS — M1610 Unilateral primary osteoarthritis, unspecified hip: Secondary | ICD-10-CM

## 2015-05-16 DIAGNOSIS — S73005D Unspecified dislocation of left hip, subsequent encounter: Secondary | ICD-10-CM | POA: Diagnosis not present

## 2015-05-16 DIAGNOSIS — R0602 Shortness of breath: Secondary | ICD-10-CM | POA: Insufficient documentation

## 2015-05-16 DIAGNOSIS — M21859 Other specified acquired deformities of unspecified thigh: Secondary | ICD-10-CM | POA: Diagnosis present

## 2015-05-16 DIAGNOSIS — M1612 Unilateral primary osteoarthritis, left hip: Principal | ICD-10-CM | POA: Insufficient documentation

## 2015-05-16 DIAGNOSIS — M25552 Pain in left hip: Secondary | ICD-10-CM | POA: Diagnosis present

## 2015-05-16 DIAGNOSIS — S73005A Unspecified dislocation of left hip, initial encounter: Secondary | ICD-10-CM | POA: Diagnosis present

## 2015-05-16 DIAGNOSIS — E119 Type 2 diabetes mellitus without complications: Secondary | ICD-10-CM | POA: Insufficient documentation

## 2015-05-16 DIAGNOSIS — S73006A Unspecified dislocation of unspecified hip, initial encounter: Secondary | ICD-10-CM | POA: Insufficient documentation

## 2015-05-16 DIAGNOSIS — F0390 Unspecified dementia without behavioral disturbance: Secondary | ICD-10-CM | POA: Diagnosis not present

## 2015-05-16 HISTORY — DX: Unspecified osteoarthritis, unspecified site: M19.90

## 2015-05-16 LAB — GLUCOSE, CAPILLARY
Glucose-Capillary: 116 mg/dL — ABNORMAL HIGH (ref 65–99)
Glucose-Capillary: 121 mg/dL — ABNORMAL HIGH (ref 65–99)
Glucose-Capillary: 101 mg/dL — ABNORMAL HIGH (ref 65–99)
Glucose-Capillary: 114 mg/dL — ABNORMAL HIGH (ref 65–99)
Glucose-Capillary: 113 mg/dL — ABNORMAL HIGH (ref 65–99)

## 2015-05-16 LAB — URINALYSIS, ROUTINE W REFLEX MICROSCOPIC
BILIRUBIN URINE: NEGATIVE
Bilirubin Urine: NEGATIVE
GLUCOSE, UA: NEGATIVE mg/dL
GLUCOSE, UA: NEGATIVE mg/dL
HGB URINE DIPSTICK: NEGATIVE
Hgb urine dipstick: NEGATIVE
KETONES UR: NEGATIVE mg/dL
Ketones, ur: NEGATIVE mg/dL
Leukocytes, UA: NEGATIVE
Leukocytes, UA: NEGATIVE
Nitrite: NEGATIVE
Nitrite: POSITIVE — AB
PH: 7.5 (ref 5.0–8.0)
PROTEIN: NEGATIVE mg/dL
Protein, ur: NEGATIVE mg/dL
SPECIFIC GRAVITY, URINE: 1.01 (ref 1.005–1.030)
Specific Gravity, Urine: 1.004 — ABNORMAL LOW (ref 1.005–1.030)
Urobilinogen, UA: 0.2 mg/dL (ref 0.0–1.0)
Urobilinogen, UA: 0.2 mg/dL (ref 0.0–1.0)
pH: 7 (ref 5.0–8.0)

## 2015-05-16 LAB — CBC WITH DIFFERENTIAL/PLATELET
BASOS PCT: 0 %
Basophils Absolute: 0 10*3/uL (ref 0.0–0.1)
EOS PCT: 4 %
Eosinophils Absolute: 0.2 10*3/uL (ref 0.0–0.7)
HEMATOCRIT: 35.4 % — AB (ref 36.0–46.0)
HEMOGLOBIN: 10.9 g/dL — AB (ref 12.0–15.0)
LYMPHS ABS: 2.4 10*3/uL (ref 0.7–4.0)
LYMPHS PCT: 45 %
MCH: 22.5 pg — AB (ref 26.0–34.0)
MCHC: 30.8 g/dL (ref 30.0–36.0)
MCV: 73 fL — AB (ref 78.0–100.0)
MONOS PCT: 8 %
Monocytes Absolute: 0.4 10*3/uL (ref 0.1–1.0)
NEUTROS ABS: 2.3 10*3/uL (ref 1.7–7.7)
Neutrophils Relative %: 43 %
Platelets: 268 10*3/uL (ref 150–400)
RBC: 4.85 MIL/uL (ref 3.87–5.11)
RDW: 15.8 % — AB (ref 11.5–15.5)
WBC: 5.3 10*3/uL (ref 4.0–10.5)

## 2015-05-16 LAB — I-STAT CHEM 8, ED
BUN: 13 mg/dL (ref 6–20)
CALCIUM ION: 1.2 mmol/L (ref 1.13–1.30)
CREATININE: 0.9 mg/dL (ref 0.44–1.00)
Chloride: 101 mmol/L (ref 101–111)
GLUCOSE: 105 mg/dL — AB (ref 65–99)
HEMATOCRIT: 39 % (ref 36.0–46.0)
Hemoglobin: 13.3 g/dL (ref 12.0–15.0)
Potassium: 4.1 mmol/L (ref 3.5–5.1)
Sodium: 140 mmol/L (ref 135–145)
TCO2: 24 mmol/L (ref 0–100)

## 2015-05-16 LAB — URINE MICROSCOPIC-ADD ON

## 2015-05-16 MED ORDER — POLYETHYLENE GLYCOL 3350 17 G PO PACK
17.0000 g | PACK | Freq: Every day | ORAL | Status: DC
  Administered 2015-05-16: 17 g via ORAL

## 2015-05-16 MED ORDER — ACETAMINOPHEN 500 MG PO TABS
1000.0000 mg | ORAL_TABLET | Freq: Two times a day (BID) | ORAL | Status: DC
  Administered 2015-05-16 – 2015-05-17 (×3): 1000 mg via ORAL
  Filled 2015-05-16 (×4): qty 2

## 2015-05-16 MED ORDER — DONEPEZIL HCL 10 MG PO TABS
10.0000 mg | ORAL_TABLET | Freq: Every day | ORAL | Status: DC
  Administered 2015-05-16: 10 mg via ORAL
  Filled 2015-05-16 (×2): qty 1

## 2015-05-16 MED ORDER — TOPIRAMATE 25 MG PO TABS
50.0000 mg | ORAL_TABLET | Freq: Two times a day (BID) | ORAL | Status: DC
  Administered 2015-05-16 – 2015-05-17 (×3): 50 mg via ORAL
  Filled 2015-05-16 (×4): qty 2

## 2015-05-16 MED ORDER — BENZTROPINE MESYLATE 1 MG PO TABS
1.0000 mg | ORAL_TABLET | Freq: Two times a day (BID) | ORAL | Status: DC
  Administered 2015-05-16 – 2015-05-17 (×3): 1 mg via ORAL
  Filled 2015-05-16 (×4): qty 1

## 2015-05-16 MED ORDER — METOPROLOL TARTRATE 25 MG PO TABS
25.0000 mg | ORAL_TABLET | Freq: Two times a day (BID) | ORAL | Status: DC
  Administered 2015-05-16 – 2015-05-17 (×3): 25 mg via ORAL
  Filled 2015-05-16 (×4): qty 1

## 2015-05-16 MED ORDER — RISPERIDONE 3 MG PO TABS
3.0000 mg | ORAL_TABLET | Freq: Two times a day (BID) | ORAL | Status: DC
  Administered 2015-05-16 – 2015-05-17 (×3): 3 mg via ORAL
  Filled 2015-05-16 (×4): qty 1

## 2015-05-16 MED ORDER — HYDROCODONE-ACETAMINOPHEN 5-325 MG PO TABS
1.0000 | ORAL_TABLET | Freq: Four times a day (QID) | ORAL | Status: DC | PRN
  Administered 2015-05-17: 1 via ORAL
  Filled 2015-05-16: qty 1

## 2015-05-16 MED ORDER — FENTANYL CITRATE (PF) 100 MCG/2ML IJ SOLN
50.0000 ug | Freq: Once | INTRAMUSCULAR | Status: AC
  Administered 2015-05-16: 50 ug via INTRAVENOUS
  Filled 2015-05-16: qty 2

## 2015-05-16 MED ORDER — GABAPENTIN 100 MG PO CAPS
100.0000 mg | ORAL_CAPSULE | Freq: Every day | ORAL | Status: DC
  Administered 2015-05-16 – 2015-05-17 (×2): 100 mg via ORAL
  Filled 2015-05-16 (×2): qty 1

## 2015-05-16 MED ORDER — LIP MEDEX EX OINT
TOPICAL_OINTMENT | CUTANEOUS | Status: AC
  Administered 2015-05-16: 1
  Filled 2015-05-16: qty 7

## 2015-05-16 MED ORDER — ASPIRIN EC 325 MG PO TBEC: 325.0000 mg | DELAYED_RELEASE_TABLET | Freq: Every day | ORAL | Status: DC

## 2015-05-16 MED ORDER — ALPRAZOLAM 0.5 MG PO TABS: 0.5000 mg | ORAL_TABLET | Freq: Three times a day (TID) | ORAL | Status: DC | PRN

## 2015-05-16 MED ORDER — PANTOPRAZOLE SODIUM 40 MG PO TBEC
40.0000 mg | DELAYED_RELEASE_TABLET | Freq: Every day | ORAL | Status: DC
  Administered 2015-05-16 – 2015-05-17 (×2): 40 mg via ORAL
  Filled 2015-05-16 (×2): qty 1

## 2015-05-16 MED ORDER — LISINOPRIL 10 MG PO TABS
10.0000 mg | ORAL_TABLET | Freq: Every day | ORAL | Status: DC
  Administered 2015-05-16 – 2015-05-17 (×2): 10 mg via ORAL
  Filled 2015-05-16 (×2): qty 1

## 2015-05-16 MED ORDER — ENOXAPARIN SODIUM 60 MG/0.6ML ~~LOC~~ SOLN
0.5000 mg/kg | SUBCUTANEOUS | Status: DC
  Administered 2015-05-16: 45 mg via SUBCUTANEOUS
  Filled 2015-05-16 (×2): qty 0.6

## 2015-05-16 MED ORDER — INSULIN ASPART 100 UNIT/ML ~~LOC~~ SOLN
0.0000 [IU] | SUBCUTANEOUS | Status: DC
  Administered 2015-05-16 – 2015-05-17 (×2): 1 [IU] via SUBCUTANEOUS
  Administered 2015-05-17: 2 [IU] via SUBCUTANEOUS

## 2015-05-16 MED ORDER — FERROUS SULFATE 325 (65 FE) MG PO TABS
325.0000 mg | ORAL_TABLET | Freq: Two times a day (BID) | ORAL | Status: DC
  Administered 2015-05-16 – 2015-05-17 (×3): 325 mg via ORAL
  Filled 2015-05-16 (×4): qty 1

## 2015-05-16 MED ORDER — ESCITALOPRAM OXALATE 10 MG PO TABS
10.0000 mg | ORAL_TABLET | Freq: Every day | ORAL | Status: DC
  Administered 2015-05-16 – 2015-05-17 (×2): 10 mg via ORAL
  Filled 2015-05-16 (×2): qty 1

## 2015-05-16 MED ORDER — MORPHINE SULFATE (PF) 2 MG/ML IV SOLN: 0.5000 mg | INTRAVENOUS | Status: DC | PRN

## 2015-05-16 MED ORDER — DOCUSATE SODIUM 100 MG PO CAPS
100.0000 mg | ORAL_CAPSULE | Freq: Two times a day (BID) | ORAL | Status: DC
  Administered 2015-05-16 – 2015-05-17 (×3): 100 mg via ORAL

## 2015-05-16 NOTE — H&P (Signed)
[facility] History and Physical  [name] JXB:[id number] DOB: 21-Feb-1941 DOA: 05/16/2015  Referring physician: EDP PCP: Mikki Harbor   Chief Complaint: Left hip pain   HPI: [name] is a 74 y.o. female who presents to the ED with c/o, left leg pain and inability to ambulate due to pain for the past 3 days.  She has had chronic left hip pain but this worsened acutely 3 days ago and she has been unable to ambulate since that time.  At baseline she walks unassisted.  She has tried nothing specific for the pain.  She reports no specific fall or trauma.  Review of Systems: Systems reviewed.  As above, otherwise negative  Past Medical History  Diagnosis Date  . Diabetes mellitus   . Poor historian   . Schizophrenia, schizo-affective (HCC)   . Anemia   . Tachycardia   . Asthma   . Shortness of breath on exertion   . Rectal bleeding   . Cervical stenosis of spine   . Hypertension   . Arthritis    Past Surgical History  Procedure Laterality Date  . Cesarean section      x4   Social History:  reports that she has never smoked. She has never used smokeless tobacco. She reports that she does not drink alcohol or use illicit drugs.  No Known Allergies  History reviewed. No pertinent family history.   Prior to Admission medications   Medication Sig Start Date End Date Taking? Authorizing Provider  acetaminophen (TYLENOL) 500 MG tablet Take 1,000 mg by mouth 2 (two) times daily. Patient takes for arthritis pain.   Yes Historical Provider, MD  ALPRAZolam Prudy Feeler) 0.5 MG tablet Take 0.5 mg by mouth 3 (three) times daily as needed. For agitation/anxiety   Yes Historical Provider, MD  aspirin 81 MG chewable tablet Chew 81 mg by mouth daily.   Yes Historical Provider, MD  benztropine (COGENTIN) 1 MG tablet Take 1 mg by mouth 2 (two) times daily.   Yes Historical Provider, MD  diclofenac sodium (VOLTAREN) 1 % GEL Apply 2 g topically 2 (two) times daily. Applied to  affected area on left knee   Yes Historical Provider, MD  docusate sodium (COLACE) 100 MG capsule Take 100 mg by mouth 2 (two) times daily.   Yes Historical Provider, MD  donepezil (ARICEPT) 10 MG tablet Take 10 mg by mouth at bedtime.   Yes Historical Provider, MD  escitalopram (LEXAPRO) 10 MG tablet Take 10 mg by mouth daily.   Yes Historical Provider, MD  ferrous sulfate 325 (65 FE) MG tablet Take 325 mg by mouth 2 (two) times daily. anemia   Yes Historical Provider, MD  gabapentin (NEURONTIN) 100 MG capsule Take 100 mg by mouth daily. Give daily at 3pm. Do not give with Risperidone.   Yes Historical Provider, MD  lisinopril (PRINIVIL,ZESTRIL) 10 MG tablet Take 10 mg by mouth daily.   Yes Historical Provider, MD  metFORMIN (GLUCOPHAGE) 500 MG tablet Take 500 mg by mouth 2 (two) times daily with a meal.   Yes Historical Provider, MD  metoprolol tartrate (LOPRESSOR) 25 MG tablet Take 25 mg by mouth 2 (two) times daily.   Yes Historical Provider, MD  omeprazole (PRILOSEC) 20 MG capsule Take 20 mg by mouth daily.   Yes Historical Provider, MD  polyethylene glycol (MIRALAX / GLYCOLAX) packet Take 17 g by mouth daily.   Yes Historical Provider, MD  risperiDONE (RISPERDAL) 3 MG tablet Take 3 mg by  mouth 2 (two) times daily.   Yes Historical Provider, MD  topiramate (TOPAMAX) 50 MG tablet Take 50 mg by mouth 2 (two) times daily.   Yes Historical Provider, MD  vitamin C (ASCORBIC ACID) 500 MG tablet Take 500 mg by mouth 2 (two) times daily.   Yes Historical Provider, MD   Physical Exam: Filed Vitals:   05/16/15 0133  BP: 150/91  Pulse: 72  Temp: 97.8 F (36.6 C)    BP 150/91 mmHg  Pulse 72  Temp(Src) 97.8 F (36.6 C) (Oral)  SpO2 98%  General Appearance:    Alert, oriented, no distress, appears stated age  Head:    Normocephalic, atraumatic  Eyes:    PERRL, EOMI, sclera non-icteric        Nose:   Nares without drainage or epistaxis. Mucosa, turbinates normal  Throat:   Moist mucous  membranes. Oropharynx without erythema or exudate.  Neck:   Supple. No carotid bruits.  No thyromegaly.  No lymphadenopathy.   Back:     No CVA tenderness, no spinal tenderness  Lungs:     Clear to auscultation bilaterally, without wheezes, rhonchi or rales  Chest wall:    No tenderness to palpitation  Heart:    Regular rate and rhythm without murmurs, gallops, rubs  Abdomen:     Soft, non-tender, nondistended, normal bowel sounds, no organomegaly  Genitalia:    deferred  Rectal:    deferred  Extremities:   No clubbing, cyanosis or edema.  Pulses:   2+ and symmetric all extremities  Skin:   Skin color, texture, turgor normal, no rashes or lesions  Lymph nodes:   Cervical, supraclavicular, and axillary nodes normal  Neurologic:   CNII-XII intact. Normal strength, sensation and reflexes      throughout    Labs on Admission:  Basic Metabolic Panel:  Recent Labs Lab 05/16/15 0346  NA 140  K 4.1  CL 101  GLUCOSE 105*  BUN 13  CREATININE 0.90   Liver Function Tests: No results for input(s): AST, ALT, ALKPHOS, BILITOT, PROT, ALBUMIN in the last 168 hours. No results for input(s): LIPASE, AMYLASE in the last 168 hours. No results for input(s): AMMONIA in the last 168 hours. CBC:  Recent Labs Lab 05/16/15 0338 05/16/15 0346  WBC 5.3  --   NEUTROABS 2.3  --   HGB 10.9* 13.3  HCT 35.4* 39.0  MCV 73.0*  --   PLT 268  --    Cardiac Enzymes: No results for input(s): CKTOTAL, CKMB, CKMBINDEX, TROPONINI in the last 168 hours.  BNP (last 3 results) No results for input(s): PROBNP in the last 8760 hours. CBG: No results for input(s): GLUCAP in the last 168 hours.  Radiological Exams on Admission: Dg Chest 2 View  05/16/2015  CLINICAL DATA:  Shortness of breath for several days. Hurts all over. EXAM: CHEST  2 VIEW COMPARISON:  07/28/2009 FINDINGS: Shallow inspiration. Normal heart size and pulmonary vascularity. No focal airspace disease or consolidation in the lungs. No  blunting of costophrenic angles. No pneumothorax. Degenerative changes in the spine. IMPRESSION: Shallow inspiration.  No evidence of active pulmonary disease Electronically Signed   By: Burman Nieves M.D.   On: 05/16/2015 03:08   Ct Hip Left Wo Contrast  05/16/2015  CLINICAL DATA:  Left hip pain.  No trauma. EXAM: CT OF THE LEFT HIP WITHOUT CONTRAST TECHNIQUE: Multidetector CT imaging of the left hip was performed according to the standard protocol. Multiplanar CT image reconstructions were also  generated. COMPARISON:  Radiographs 05/16/2015 and earlier FINDINGS: There are severe chronic arthropathic changes of the left hip. There is chronic fragmentation of the femoral head with marked sclerosis. There is flattening, sclerosis and subchondral degenerative cyst formation of the acetabulum. There is long-standing superior subluxation of the hip, with pseudoarticulation in the supra-acetabular region associated with the chronic flattening, chronic sclerosis and fragmentation. No conclusive acute fracture is present although the numerous old fragments will reduce sensitivity. No bone lesion or mass is evident. No acute soft tissue abnormality is evident. 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 Marked flattening of the femoral head and chronic sclerosis/fragmentation. No conclusive acute findings. Electronically Signed   By: Ellery Plunkaniel R Mitchell M.D.   On: 05/16/2015 04:22   Dg Knee Complete 4 Views Left  05/16/2015  CLINICAL DATA:  No known trauma.  Knee pain for several weeks. EXAM: LEFT KNEE - COMPLETE 4+ VIEW COMPARISON:  02/01/2014 FINDINGS: There is poor bone density, decreasing sensitivity for nondisplaced fracture. There is extensive chondrocalcinosis. There are intra-articular loose bodies. Moderate degenerative changes are present involving all compartments. No bone lesion or bony destruction. No significant change from 02/01/2014.  IMPRESSION: Chondrocalcinosis and tricompartment degenerative changes. Intra-articular loose bodies. No conclusive acute findings. The poor bone density decreases sensitivity. Electronically Signed   By: Ellery Plunkaniel R Mitchell M.D.   On: 05/16/2015 04:04   Dg Hip Unilat With Pelvis 2-3 Views Left  05/16/2015  CLINICAL DATA:  In painful left hip for over a week. Cannot bear weight. No specific injury. 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 Severe degenerative changes in the left hip with loss of joint space and resorption and remodeling of the left femoral head. Heterotopic ossification is present. Appearance is unchanged since previous study. No evidence of acute fracture or dislocation. Degenerative changes present in the right hip and lower lumbar spine. Pelvis appears otherwise intact. SI joints and symphysis pubis are not displaced. Sacrum is obscured by bowel gas. IMPRESSION: Changes of left hip dysplasia with severe degenerative change in bone resorption/ remodeling of the femoral head. No acute bony abnormalities. Electronically Signed   By: Burman NievesWilliam  Stevens M.D.   On: 05/16/2015 03:07    EKG: Independently reviewed.  Assessment/Plan Principal Problem:   Dislocation of left hip (HCC) Active Problems:   Schizophrenia (HCC)   Essential hypertension   Dementia   1. Left hip pain - It turns out that the patient has extreme end stage degenerative changes of her L hip spanning several years, it was dislocated 3 years ago, and it appears that she has been walking around on the dislocated hip ever since this time (somehow). 1. Ortho to see in AM 2. I anticipate she will most likely need THA 3. NPO 4. Hip FX pathway for now 2. Schizophrenia - continue home meds 3. HTN - continue home meds 4. DM2 - hold metformin while NPO, SSI q4h 5. DVT ppx - SCDs only for now  Dr. Eulah PontMurphy consulted by EDP and either  he or someone in his group will see patient in AM.  Code Status: Full Family Communication: Attempted to contact daughter on phone, left voicemail Disposition Plan: Admit to inpatient   Time spent: 70 min  GARDNER, JARED M. [facility] Pager [phone]  If 7AM-7PM, please contact the day team taking care of the patient [email or web address] Password [email or web address] 05/16/2015, 5:27 AM

## 2015-05-16 NOTE — ED Provider Notes (Signed)
CSN: [id number]     Arrival date & time 05/16/15  0132 History  By signing my name below, I, Emmanuella Mensah, attest that this documentation has been prepared under the direction and in the presence of Ravneet Spilker, MD. Electronically Signed: Angelene Giovanni, ED Scribe. 05/16/2015. 2:34 AM.    Chief Complaint  Patient presents with  . Knee Pain    bilateral  . Urinary Incontinence    x1 week   Patient is a 74 y.o. female presenting with knee pain. The history is provided by the patient. No language interpreter was used.  Knee Pain Location:  Leg Injury: no   Leg location:  L leg and L upper leg Pain details:    Quality:  Unable to specify   Radiates to:  L leg   Severity:  Moderate   Onset quality:  Gradual   Duration:  3 days   Timing:  Constant   Progression:  Worsening Chronicity:  New Foreign body present:  No foreign bodies Prior injury to area:  Unable to specify Relieved by:  None tried Worsened by:  Nothing tried Ineffective treatments:  None tried Associated symptoms: no fever and no swelling   Risk factors: no concern for non-accidental trauma    HPI Comments: [name] is a 74 y.o. female with a hx of DM, HTN, arthritis, schizophrenia, and anemia, who presents to the Emergency Department complaining of gradually worsening constant left leg pain onset 5 days ago. She reports associated frequency and trouble controlling her bladder. She denies any swelling. Pt's left leg is externally rotated. No recent injuries or alleviating factors noted.   Past Medical History  Diagnosis Date  . Diabetes mellitus   . Poor historian   . Schizophrenia, schizo-affective (HCC)   . Anemia   . Tachycardia   . Asthma   . Shortness of breath on exertion   . Rectal bleeding   . Cervical stenosis of spine   . Hypertension   . Arthritis    Past Surgical History  Procedure Laterality Date  . Cesarean section      x4   History reviewed. No pertinent family  history. Social History  Substance Use Topics  . Smoking status: Never Smoker   . Smokeless tobacco: Never Used  . Alcohol Use: No   OB History    No data available     Review of Systems  Constitutional: Negative for fever.  Genitourinary: Positive for frequency.  Musculoskeletal: Positive for arthralgias. Negative for joint swelling.  All other systems reviewed and are negative.     Allergies  Review of patient's allergies indicates no known allergies.  Home Medications   Prior to Admission medications   Medication Sig Start Date End Date Taking? Authorizing Provider  acetaminophen (TYLENOL) 500 MG tablet Take 1,000 mg by mouth 2 (two) times daily. Patient takes for arthritis pain.   Yes Historical Provider, MD  ALPRAZolam Prudy Feeler) 0.5 MG tablet Take 0.5 mg by mouth 3 (three) times daily as needed. For agitation/anxiety   Yes Historical Provider, MD  aspirin 81 MG chewable tablet Chew 81 mg by mouth daily.   Yes Historical Provider, MD  benztropine (COGENTIN) 1 MG tablet Take 1 mg by mouth 2 (two) times daily.   Yes Historical Provider, MD  diclofenac sodium (VOLTAREN) 1 % GEL Apply 2 g topically 2 (two) times daily. Applied to affected area on left knee   Yes Historical Provider, MD  docusate sodium (COLACE) 100 MG capsule Take 100  mg by mouth 2 (two) times daily.   Yes Historical Provider, MD  donepezil (ARICEPT) 10 MG tablet Take 10 mg by mouth at bedtime.   Yes Historical Provider, MD  escitalopram (LEXAPRO) 10 MG tablet Take 10 mg by mouth daily.   Yes Historical Provider, MD  ferrous sulfate 325 (65 FE) MG tablet Take 325 mg by mouth 2 (two) times daily. anemia   Yes Historical Provider, MD  gabapentin (NEURONTIN) 100 MG capsule Take 100 mg by mouth daily. Give daily at 3pm. Do not give with Risperidone.   Yes Historical Provider, MD  lisinopril (PRINIVIL,ZESTRIL) 10 MG tablet Take 10 mg by mouth daily.   Yes Historical Provider, MD  metFORMIN (GLUCOPHAGE) 500 MG tablet  Take 500 mg by mouth 2 (two) times daily with a meal.   Yes Historical Provider, MD  metoprolol tartrate (LOPRESSOR) 25 MG tablet Take 25 mg by mouth 2 (two) times daily.   Yes Historical Provider, MD  omeprazole (PRILOSEC) 20 MG capsule Take 20 mg by mouth daily.   Yes Historical Provider, MD  polyethylene glycol (MIRALAX / GLYCOLAX) packet Take 17 g by mouth daily.   Yes Historical Provider, MD  risperiDONE (RISPERDAL) 3 MG tablet Take 3 mg by mouth 2 (two) times daily.   Yes Historical Provider, MD  topiramate (TOPAMAX) 50 MG tablet Take 50 mg by mouth 2 (two) times daily.   Yes Historical Provider, MD  vitamin C (ASCORBIC ACID) 500 MG tablet Take 500 mg by mouth 2 (two) times daily.   Yes Historical Provider, MD   BP 150/91 mmHg  Pulse 72  Temp(Src) 97.8 F (36.6 C) (Oral)  SpO2 98% Physical Exam  Constitutional: She is oriented to person, place, and time. She appears well-developed and well-nourished. No distress.  HENT:  Head: Normocephalic and atraumatic.  Mouth/Throat: Oropharynx is clear and moist. No oropharyngeal exudate.  Eyes: Conjunctivae and EOM are normal. Pupils are equal, round, and reactive to light.  Neck: Neck supple. No tracheal deviation present.  Cardiovascular: Normal rate and regular rhythm.   Pulmonary/Chest: Effort normal. No respiratory distress. She has no wheezes. She has no rales.  Lungs clear  Abdominal: Soft. Bowel sounds are normal. There is no rebound and no guarding.  Musculoskeletal:       Left hip: She exhibits decreased range of motion, tenderness and bony tenderness. She exhibits no swelling.       Left knee: She exhibits normal range of motion, no swelling, no effusion, no ecchymosis, no deformity, no laceration, no erythema, normal alignment, no LCL laxity, normal patellar mobility, no bony tenderness, normal meniscus and no MCL laxity. No tenderness found. No medial joint line, no lateral joint line, no MCL, no LCL and no patellar tendon  tenderness noted.  Left leg externally rotated 90 degree and foreshortened, intact dorsal pedis   Neurological: She is alert and oriented to person, place, and time.  Intact sensation  Skin: Skin is warm and dry.  Psychiatric: She has a normal mood and affect. Her behavior is normal.  Nursing note and vitals reviewed.   ED Course  Procedures (including critical care time) DIAGNOSTIC STUDIES: Oxygen Saturation is 99% on RA, normal by my interpretation.    COORDINATION OF CARE: 2:26 AM- Pt advised of plan for treatment and pt agrees. Pt will receive an X-ray and urinalysis.   Labs Review Labs Reviewed  URINALYSIS, ROUTINE W REFLEX MICROSCOPIC (NOT AT [facility])    Imaging Review No results found. Cy Blamer, MD  has personally reviewed and evaluated these images and lab results as part of her medical decision-making.   EKG Interpretation None      MDM   Final diagnoses:  None    Case d/w Dr. Wandra Feinstein Murphy who will see patient in consultation. Please admit to hospitalist  I personally performed the services described in this documentation, which was scribed in my presence. The recorded information has been reviewed and is accurate.     Cy BlamerApril Kingston Guiles, MD 05/16/15 [phone]

## 2015-05-16 NOTE — Progress Notes (Signed)
Initial Nutrition Assessment  DOCUMENTATION CODES:   Obesity unspecified  INTERVENTION:   Diet advancement per MD Once diet is advanced, RD to monitor plan and PO intake for need of supplements  NUTRITION DIAGNOSIS:   Inadequate oral intake related to inability to eat as evidenced by NPO status.  GOAL:   Patient will meet greater than or equal to 90% of their needs  MONITOR:   Diet advancement, Labs, Weight trends, Skin, I & O's  REASON FOR ASSESSMENT:   Consult Hip fracture protocol  ASSESSMENT:   74 y.o. female who presents to the ED with c/o, left leg pain and inability to ambulate due to pain for the past 3 days.  Patient in room reports good appetite PTA and was eating well. UBW is 204 lb. Pt presents with some confusion. Pt has had some weight gain since 2013. Will monitor for plan (possible surgery) and assess if patient needs supplements based on PO intake after diet is advanced.  Nutrition focused physical exam shows no sign of depletion of muscle mass or body fat.  Labs reviewed.  Diet Order:  Diet NPO time specified  Skin:  Wound (see comment) (back wound)  Last BM:  11/2  Height:   Ht Readings from Last 1 Encounters:  05/16/15 5\' 2"  (1.575 m)    Weight:   Wt Readings from Last 1 Encounters:  05/16/15 200 lb 2.8 oz (90.8 kg)    Ideal Body Weight:  50 kg  BMI:  Body mass index is 36.6 kg/(m^2).  Estimated Nutritional Needs:   Kcal:  1500-1700  Protein:  75-85g  Fluid:  1.7L/day  EDUCATION NEEDS:   No education needs identified at this time  Tilda FrancoLindsey Evvie Behrmann, MS, RD, LDN Pager: [phone] After Hours Pager: [phone]

## 2015-05-16 NOTE — Care Management Note (Signed)
Case Management Note  Patient Details  Name: Evlyn CourierMargaret B Vanbergen MRN: [id number] Date of Birth: [birth year]  Subjective/Objective:                   Left hip pain Action/Plan: Discharge planning  Expected Discharge Date:                  Expected Discharge Plan:  Skilled Nursing Facility  In-House Referral:     Discharge planning Services  CM Consult  Post Acute Care Choice:    Choice offered to:     DME Arranged:    DME Agency:     HH Arranged:    HH Agency:     Status of Service:     Medicare Important Message Given:    Date Medicare IM Given:    Medicare IM give by:    Date Additional Medicare IM Given:    Additional Medicare Important Message give by:     If discussed at Long Length of Stay Meetings, dates discussed:    Additional Comments: [facility]  CM will follow for progress and discharge needs. Yves DillJeffries, Aydn Ferrara Christine, RN 05/16/2015, 11:00 AM

## 2015-05-16 NOTE — Progress Notes (Signed)
ANTICOAGULATION CONSULT NOTE - Initial Consult  Pharmacy Consult for Lovenox Indication: VTE prophylaxis  No Known Allergies  Patient Measurements: Height: 5\' 2"  (157.5 cm) Weight: 200 lb 2.8 oz (90.8 kg) IBW/kg (Calculated) : 50.1  Vital Signs: Temp: 98.3 F (36.8 C) (11/03 1337) Temp Source: Oral (11/03 1337) BP: 139/48 mmHg (11/03 1337) Pulse Rate: 65 (11/03 1337)  Labs:  Recent Labs  05/16/15 0338 05/16/15 0346  HGB 10.9* 13.3  HCT 35.4* 39.0  PLT 268  --   CREATININE  --  0.90    Estimated Creatinine Clearance: 59.2 mL/min (by C-G formula based on Cr of 0.9).   Medical History: Past Medical History  Diagnosis Date  . Diabetes mellitus   . Poor historian   . Schizophrenia, schizo-affective (HCC)   . Anemia   . Tachycardia   . Asthma   . Shortness of breath on exertion   . Rectal bleeding   . Cervical stenosis of spine   . Hypertension   . Arthritis    Assessment: [age] y/oF with PMH of DM, schizophrenia, asthma who [facility] with c/o leg leg pain and inability to ambulate due to pain x 3 days. She has had chronic L hip pain, but this acutely worsened 3 days ago. CT scan shows severe chronic arthropathy of the L hip with superior subluxation and pseudoarticulation, marked flattening of the femoral head and chronic sclerosis/fragmentation. Ortho consulted and does not recommend any acute intervention during this hospitalization. Pharmacy consulted to assist with dosing of Lovenox for VTE prophylaxis.    Today, 05/16/2015:  SCr 0.9, CrCl ~ 59 ml/min CG  Hgb 13.3, Pltc 268K, WNL  BMI ~ 37  Goal of Therapy:  Absence of VTE Monitor platelets by anticoagulation protocol: Yes   Plan:   Lovenox 0.5 mg/kg SQ q24h for BMI > 30, CrCl > 30 ml/min.  Monitor CBC, renal function, and for s/s of bleeding.   Greer PickerelJigna Naomi Castrogiovanni, PharmD, BCPS Pager: [phone] 05/16/2015 2:17 PM

## 2015-05-16 NOTE — Consult Note (Signed)
ORTHOPAEDIC CONSULTATION  REQUESTING PHYSICIAN: Reyne Dumas, MD  Chief Complaint: L hip pain  HPI: [name] is a 74 y.o. female who complains of chronic hip pain that has increased over the past few days.  The patient is a poor historian.  Per the patient she has been non-ambulatory for 2 years, but EMS reports that she walked into the ambulance. The patient reports that pain is in the groin and is grinding in nature.  She says that ambulation increases the pain.  Per the patient's daughter, she has been ambulatory for short distances, but mostly mobilizes with a wheelchair.  The daughter also reports that years ago she had the hip evaluated by an orthopedic, but has not had any further follow up. The patient denies pain radiating down the leg.  She has a hx of schizophrenia, DM, and HTN.    Past Medical History  Diagnosis Date  . Diabetes mellitus   . Poor historian   . Schizophrenia, schizo-affective ([place])   . Anemia   . Tachycardia   . Asthma   . Shortness of breath on exertion   . Rectal bleeding   . Cervical stenosis of spine   . Hypertension   . Arthritis    Past Surgical History  Procedure Laterality Date  . Cesarean section      x4   Social History   Social History  . Marital Status: Divorced    Spouse Name: N/A  . Number of Children: N/A  . Years of Education: N/A   Social History Main Topics  . Smoking status: Never Smoker   . Smokeless tobacco: Never Used  . Alcohol Use: No  . Drug Use: No  . Sexual Activity: No   Other Topics Concern  . None   Social History Narrative   History reviewed. No pertinent family history. No Known Allergies Prior to Admission medications   Medication Sig Start Date End Date Taking? Authorizing Provider  acetaminophen (TYLENOL) 500 MG tablet Take 1,000 mg by mouth 2 (two) times daily. Patient takes for arthritis pain.   Yes Historical Provider, MD  ALPRAZolam Duanne Moron) 0.5 MG tablet Take 0.5 mg by mouth 3  (three) times daily as needed. For agitation/anxiety   Yes Historical Provider, MD  aspirin 81 MG chewable tablet Chew 81 mg by mouth daily.   Yes Historical Provider, MD  benztropine (COGENTIN) 1 MG tablet Take 1 mg by mouth 2 (two) times daily.   Yes Historical Provider, MD  diclofenac sodium (VOLTAREN) 1 % GEL Apply 2 g topically 2 (two) times daily. Applied to affected area on left knee   Yes Historical Provider, MD  docusate sodium (COLACE) 100 MG capsule Take 100 mg by mouth 2 (two) times daily.   Yes Historical Provider, MD  donepezil (ARICEPT) 10 MG tablet Take 10 mg by mouth at bedtime.   Yes Historical Provider, MD  escitalopram (LEXAPRO) 10 MG tablet Take 10 mg by mouth daily.   Yes Historical Provider, MD  ferrous sulfate 325 (65 FE) MG tablet Take 325 mg by mouth 2 (two) times daily. anemia   Yes Historical Provider, MD  gabapentin (NEURONTIN) 100 MG capsule Take 100 mg by mouth daily. Give daily at 3pm. Do not give with Risperidone.   Yes Historical Provider, MD  lisinopril (PRINIVIL,ZESTRIL) 10 MG tablet Take 10 mg by mouth daily.   Yes Historical Provider, MD  metFORMIN (GLUCOPHAGE) 500 MG tablet Take 500 mg by mouth 2 (two) times daily  with a meal.   Yes Historical Provider, MD  metoprolol tartrate (LOPRESSOR) 25 MG tablet Take 25 mg by mouth 2 (two) times daily.   Yes Historical Provider, MD  omeprazole (PRILOSEC) 20 MG capsule Take 20 mg by mouth daily.   Yes Historical Provider, MD  polyethylene glycol (MIRALAX / GLYCOLAX) packet Take 17 g by mouth daily.   Yes Historical Provider, MD  risperiDONE (RISPERDAL) 3 MG tablet Take 3 mg by mouth 2 (two) times daily.   Yes Historical Provider, MD  topiramate (TOPAMAX) 50 MG tablet Take 50 mg by mouth 2 (two) times daily.   Yes Historical Provider, MD  vitamin C (ASCORBIC ACID) 500 MG tablet Take 500 mg by mouth 2 (two) times daily.   Yes Historical Provider, MD   Dg Chest 2 View  05/16/2015  CLINICAL DATA:  Shortness of breath for  several days. Hurts all over. EXAM: CHEST  2 VIEW COMPARISON:  07/28/2009 FINDINGS: Shallow inspiration. Normal heart size and pulmonary vascularity. No focal airspace disease or consolidation in the lungs. No blunting of costophrenic angles. No pneumothorax. Degenerative changes in the spine. IMPRESSION: Shallow inspiration.  No evidence of active pulmonary disease Electronically Signed   By: Lucienne Capers M.D.   On: 05/16/2015 03:08   Ct Hip Left Wo Contrast  05/16/2015  CLINICAL DATA:  Left hip pain.  No trauma. EXAM: CT OF THE LEFT HIP WITHOUT CONTRAST TECHNIQUE: Multidetector CT imaging of the left hip was performed according to the standard protocol. Multiplanar CT image reconstructions were also generated. COMPARISON:  Radiographs 05/16/2015 and earlier FINDINGS: There are severe chronic arthropathic changes of the left hip. There is chronic fragmentation of the femoral head with marked sclerosis. There is flattening, sclerosis and subchondral degenerative cyst formation of the acetabulum. There is long-standing superior subluxation of the hip, with pseudoarticulation in the supra-acetabular region associated with the chronic flattening, chronic sclerosis and fragmentation. No conclusive acute fracture is present although the numerous old fragments will reduce sensitivity. No bone lesion or mass is evident. No acute soft tissue abnormality is evident. 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 Marked flattening of the femoral head and chronic sclerosis/fragmentation. No conclusive acute findings. Electronically Signed   By: Andreas Newport M.D.   On: 05/16/2015 04:22   Dg Knee Complete 4 Views Left  05/16/2015  CLINICAL DATA:  No known trauma.  Knee pain for several weeks. EXAM: LEFT KNEE - COMPLETE 4+ VIEW COMPARISON:  02/01/2014 FINDINGS: There is poor bone density, decreasing sensitivity for nondisplaced fracture. There is extensive  chondrocalcinosis. There are intra-articular loose bodies. Moderate degenerative changes are present involving all compartments. No bone lesion or bony destruction. No significant change from 02/01/2014. IMPRESSION: Chondrocalcinosis and tricompartment degenerative changes. Intra-articular loose bodies. No conclusive acute findings. The poor bone density decreases sensitivity. Electronically Signed   By: Andreas Newport M.D.   On: 05/16/2015 04:04   Dg Hip Unilat With Pelvis 2-3 Views Left  05/16/2015  CLINICAL DATA:  In painful left hip for over a week. Cannot bear weight. No specific injury. 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 Severe degenerative changes in the left hip with loss of joint space and resorption and remodeling of the left femoral head. Heterotopic ossification is present. Appearance is unchanged since previous study. No evidence of acute fracture or dislocation. Degenerative changes present in the right hip and  lower lumbar spine. Pelvis appears otherwise intact. SI joints and symphysis pubis are not displaced. Sacrum is obscured by bowel gas. IMPRESSION: Changes of left hip dysplasia with severe degenerative change in bone resorption/ remodeling of the femoral head. No acute bony abnormalities. Electronically Signed   By: Lucienne Capers M.D.   On: 05/16/2015 03:07    Positive ROS: All other systems have been reviewed and were otherwise negative with the exception of those mentioned in the HPI and as above.  Labs cbc  Recent Labs  05/16/15 0338 05/16/15 0346  WBC 5.3  --   HGB 10.9* 13.3  HCT 35.4* 39.0  PLT 268  --     Labs inflam No results for input(s): CRP in the last 72 hours.  Invalid input(s): ESR  Labs coag No results for input(s): INR, PTT in the last 72 hours.  Invalid input(s): PT   Recent Labs  05/16/15 0346  NA 140  K 4.1  CL 101  GLUCOSE 105*    BUN 13  CREATININE 0.90    Physical Exam: Filed Vitals:   05/16/15 1337  BP: 139/48  Pulse: 65  Temp: 98.3 F (36.8 C)  Resp: 18   General: Alert, poor historian, but able to answer all questions Cardiovascular: No pedal edema Respiratory: No cyanosis, no use of accessory musculature GI: No organomegaly, abdomen is soft and non-tender Skin: No lesions in the area of chief complaint other than those listed below in MSK exam.  Neurologic: Sensation intact distally Lymphatic: No axillary or cervical lymphadenopathy  MUSCULOSKELETAL:  L hip has no swelling or erythema.  No tenderness to palpation.  Pain with ROM and log roll. Sensation intact with 2+ distal pulses.  Other extremities are atraumatic with painless ROM and NVI.  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  No indication for urgent surgical intervention, but recommend evaluation by specialist to determine if surgical intervention could improve mobility.  Recommend pain control at this time.   Gae Dry, PA-C Cell [phone]   05/16/2015 3:20 PM

## 2015-05-16 NOTE — ED Notes (Signed)
Pt. Daughter Harriet Butte(Veronica Green) [phone].

## 2015-05-16 NOTE — ED Notes (Signed)
EKG given to EDP,Palumbo,MD., for review. 

## 2015-05-16 NOTE — ED Notes (Signed)
Patient transported to X-ray 

## 2015-05-16 NOTE — Progress Notes (Addendum)
Patient seen and examined  74 y.o. female who presents to the ED with c/o, left leg pain and inability to ambulate due to pain for the past 3 days. She has had chronic left hip pain but this worsened acutely 3 days ago and she has been unable to ambulate since that time.Currently pain-free, reports taking a lot of Tylenol for her pain CT scan shows  Severe chronic arthropathy of the left hip with superior subluxation and pseudoarticulation. Marked flattening of the femoral head and chronic sclerosis  Discussed findings with Dr. Roda ShuttersXu, on-call for orthopedics as well as patient's daughter Harriet ButteVeronica Green) [phone]   Orthopedic consultation pending at this time, once cleared, patient will be ambulated with PT/OT, if surgery needed then patient is  medically stable for surgery. EKG reviewed

## 2015-05-16 NOTE — Consult Note (Signed)
ORTHOPAEDIC CONSULTATION  REQUESTING PHYSICIAN: Richarda Overlie, MD  Chief Complaint: Left hip pain  HPI: [name] is a 74 y.o. female who presents with left hip pain for many years worse in the last 3 days.  She is a poor historian secondary to her schizophrenia.  She states she has not ambulated in over 2 years but EMS reports she walked onto the ambulance.  She endorses moderate pain with ambulation that does not radiate, grinding in quality, worse with bearing weight.  Per the daughter, the patient walks minimally and has been in a wheelchair off and on for the last 2 years.  Denies any f/c/n/v.  Ortho consulted.  Past Medical History  Diagnosis Date  . Diabetes mellitus   . Poor historian   . Schizophrenia, schizo-affective ([facility])   . Anemia   . Tachycardia   . Asthma   . Shortness of breath on exertion   . Rectal bleeding   . Cervical stenosis of spine   . Hypertension   . Arthritis    Past Surgical History  Procedure Laterality Date  . Cesarean section      x4   Social History   Social History  . Marital Status: Divorced    Spouse Name: N/A  . Number of Children: N/A  . Years of Education: N/A   Social History Main Topics  . Smoking status: Never Smoker   . Smokeless tobacco: Never Used  . Alcohol Use: No  . Drug Use: No  . Sexual Activity: No   Other Topics Concern  . None   Social History Narrative   History reviewed. No pertinent family history. No Known Allergies Prior to Admission medications   Medication Sig Start Date End Date Taking? Authorizing Provider  acetaminophen (TYLENOL) 500 MG tablet Take 1,000 mg by mouth 2 (two) times daily. Patient takes for arthritis pain.   Yes Historical Provider, MD  ALPRAZolam Prudy Feeler) 0.5 MG tablet Take 0.5 mg by mouth 3 (three) times daily as needed. For agitation/anxiety   Yes Historical Provider, MD  aspirin 81 MG chewable tablet Chew 81 mg by mouth daily.   Yes Historical Provider, MD  benztropine  (COGENTIN) 1 MG tablet Take 1 mg by mouth 2 (two) times daily.   Yes Historical Provider, MD  diclofenac sodium (VOLTAREN) 1 % GEL Apply 2 g topically 2 (two) times daily. Applied to affected area on left knee   Yes Historical Provider, MD  docusate sodium (COLACE) 100 MG capsule Take 100 mg by mouth 2 (two) times daily.   Yes Historical Provider, MD  donepezil (ARICEPT) 10 MG tablet Take 10 mg by mouth at bedtime.   Yes Historical Provider, MD  escitalopram (LEXAPRO) 10 MG tablet Take 10 mg by mouth daily.   Yes Historical Provider, MD  ferrous sulfate 325 (65 FE) MG tablet Take 325 mg by mouth 2 (two) times daily. anemia   Yes Historical Provider, MD  gabapentin (NEURONTIN) 100 MG capsule Take 100 mg by mouth daily. Give daily at 3pm. Do not give with Risperidone.   Yes Historical Provider, MD  lisinopril (PRINIVIL,ZESTRIL) 10 MG tablet Take 10 mg by mouth daily.   Yes Historical Provider, MD  metFORMIN (GLUCOPHAGE) 500 MG tablet Take 500 mg by mouth 2 (two) times daily with a meal.   Yes Historical Provider, MD  metoprolol tartrate (LOPRESSOR) 25 MG tablet Take 25 mg by mouth 2 (two) times daily.   Yes Historical Provider, MD  omeprazole (PRILOSEC) 20 MG  capsule Take 20 mg by mouth daily.   Yes Historical Provider, MD  polyethylene glycol (MIRALAX / GLYCOLAX) packet Take 17 g by mouth daily.   Yes Historical Provider, MD  risperiDONE (RISPERDAL) 3 MG tablet Take 3 mg by mouth 2 (two) times daily.   Yes Historical Provider, MD  topiramate (TOPAMAX) 50 MG tablet Take 50 mg by mouth 2 (two) times daily.   Yes Historical Provider, MD  vitamin C (ASCORBIC ACID) 500 MG tablet Take 500 mg by mouth 2 (two) times daily.   Yes Historical Provider, MD   Dg Chest 2 View  05/16/2015  CLINICAL DATA:  Shortness of breath for several days. Hurts all over. EXAM: CHEST  2 VIEW COMPARISON:  07/28/2009 FINDINGS: Shallow inspiration. Normal heart size and pulmonary vascularity. No focal airspace disease or  consolidation in the lungs. No blunting of costophrenic angles. No pneumothorax. Degenerative changes in the spine. IMPRESSION: Shallow inspiration.  No evidence of active pulmonary disease Electronically Signed   By: Burman NievesWilliam  Stevens M.D.   On: 05/16/2015 03:08   Ct Hip Left Wo Contrast  05/16/2015  CLINICAL DATA:  Left hip pain.  No trauma. EXAM: CT OF THE LEFT HIP WITHOUT CONTRAST TECHNIQUE: Multidetector CT imaging of the left hip was performed according to the standard protocol. Multiplanar CT image reconstructions were also generated. COMPARISON:  Radiographs 05/16/2015 and earlier FINDINGS: There are severe chronic arthropathic changes of the left hip. There is chronic fragmentation of the femoral head with marked sclerosis. There is flattening, sclerosis and subchondral degenerative cyst formation of the acetabulum. There is long-standing superior subluxation of the hip, with pseudoarticulation in the supra-acetabular region associated with the chronic flattening, chronic sclerosis and fragmentation. No conclusive acute fracture is present although the numerous old fragments will reduce sensitivity. No bone lesion or mass is evident. No acute soft tissue abnormality is evident. 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 Marked flattening of the femoral head and chronic sclerosis/fragmentation. No conclusive acute findings. Electronically Signed   By: Ellery Plunkaniel R Mitchell M.D.   On: 05/16/2015 04:22   Dg Knee Complete 4 Views Left  05/16/2015  CLINICAL DATA:  No known trauma.  Knee pain for several weeks. EXAM: LEFT KNEE - COMPLETE 4+ VIEW COMPARISON:  02/01/2014 FINDINGS: There is poor bone density, decreasing sensitivity for nondisplaced fracture. There is extensive chondrocalcinosis. There are intra-articular loose bodies. Moderate degenerative changes are present involving all compartments. No bone lesion or bony destruction. No significant  change from 02/01/2014. IMPRESSION: Chondrocalcinosis and tricompartment degenerative changes. Intra-articular loose bodies. No conclusive acute findings. The poor bone density decreases sensitivity. Electronically Signed   By: Ellery Plunkaniel R Mitchell M.D.   On: 05/16/2015 04:04   Dg Hip Unilat With Pelvis 2-3 Views Left  05/16/2015  CLINICAL DATA:  In painful left hip for over a week. Cannot bear weight. No specific injury. 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 Severe degenerative changes in the left hip with loss of joint space and resorption and remodeling of the left femoral head. Heterotopic ossification is present. Appearance is unchanged since previous study. No evidence of acute fracture or dislocation. Degenerative changes present in the right hip and lower lumbar spine. Pelvis appears otherwise intact. SI joints and symphysis pubis are not displaced. Sacrum is obscured by bowel gas. IMPRESSION: Changes of left hip dysplasia with severe degenerative change in bone resorption/ remodeling of  the femoral head. No acute bony abnormalities. Electronically Signed   By: Burman Nieves M.D.   On: 05/16/2015 03:07    Positive ROS: All other systems have been reviewed and were otherwise negative with the exception of those mentioned in the HPI and as above.  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 Harvie Junior Glee Arvin, MD [facility] [phone] 12:38 PM

## 2015-05-16 NOTE — ED Notes (Signed)
Per EMS[facility]. Patient c/o bilateral knee pain, hx of arthritis. Patient also c/o urinary incontinence x1 week. Patient was ambulatory with EMS on scene. Patient is A&Ox4.

## 2015-05-16 NOTE — ED Notes (Signed)
IV to bedside, will return in @ 30 minutes, patient is currently in radiology

## 2015-05-16 NOTE — Progress Notes (Signed)
CSW assisting with d/c planning. CSW spoke with Lu[facility]becca [facility]. ALF is able to readmit pt on Friday if stable for d/c. PT services can be provided at ALF, if needed. Message left for pt's daughter with update.   Cori RazorJamie Patryck Kilgore LCSW [phone]

## 2015-05-16 NOTE — Clinical Social Work Note (Signed)
Clinical Social Work Assessment  Patient Details  Name: [name] MRN: [id number] Date of Birth: August 23, 1940  Date of referral:  05/16/15               Reason for consult:  Discharge Planning, Facility Placement                Permission sought to share information with:  Facility Industrial/product designerContact Representative Permission granted to share information::  Yes, Verbal Permission Granted  Name::        Agency::     Relationship::     Contact Information:     Housing/Transportation Living arrangements for the past 2 months:  Assisted Living Facility Source of Information:  Facility, Adult Children Patient Interpreter Needed:  None Criminal Activity/Legal Involvement Pertinent to Current Situation/Hospitalization:  No - Comment as needed Significant Relationships:  Adult Children Lives with:  Facility Resident (Pt may need a higher level of care.) Do you feel safe going back to the place where you live?    Need for family participation in patient care:  Yes (Comment)  Care giving concerns:  Family questions if a higher level of care is needed at d/c.   Social Worker assessment / plan:  Pt hospitalized on 05/16/15 with a dislocation of left hip. [facility] Pt sleeping soundly at this time. PN reviewed. NSG consulted. Pt has given verbal permission to speak with daughter. CSW contacted pt's daughter to assist with d/c planning. Ortho consult is pending. Daughter will be contacted once MD provides recommendations. D/C needs are unclear at this time. [facility] has been contacted and is able to readmit if a higher level of care is not needed. CSW will continue to follow to assist with d/c planning needs.  Employment status:  Retired Health and safety inspectornsurance information:  Armed forces operational officerMedicare, [place] Recommend[place]:    Information / Referral to community resources:  Skilled Nursing Facility  Patient/Family's Response to care:  D/C needs have not yet been determined.  Patient/Family's  Understanding of and Emotional Response to Diagnosis, Current Treatment, and Prognosis: Pt's daughter is waiting to hear Ortho MD recommendations. Daughter reports she would [facility] if [facility] is needed.   Emotional Assessment Appearance:  Appears stated age Attitude/Demeanor/Rapport:  Other (cooperative) Affect (typically observed):  Calm, Pleasant Orientation:  Oriented to Self, Oriented to Place, Oriented to  Time, Oriented to Situation Alcohol / Substance use:  Not Applicable Psych involvement (Current and /or in the community):  No (Comment)  Discharge Needs  Concerns to be addressed:  Discharge Planning Concerns Readmission within the last 30 days:  No Current discharge risk:  None Barriers to Discharge:  No Barriers Identified   Royetta AsalHaidinger, Aeryn Medici Lee, LCSW  [phone] 05/16/2015, 1:23 PM

## 2015-05-16 NOTE — ED Notes (Signed)
Patient c/o bilateral knee pain. Patient states they "just started hurting" on Monday. Patient states she may have fallen but she is unsure. Patient also state she is having trouble controlling her bladder, states she "can not hold it for even 15 minutes", patient states this worsened this week. Patient states she has been taking Tylenol for her knee pain at home.

## 2015-05-17 DIAGNOSIS — S73005S Unspecified dislocation of left hip, sequela: Secondary | ICD-10-CM | POA: Diagnosis not present

## 2015-05-17 DIAGNOSIS — I10 Essential (primary) hypertension: Secondary | ICD-10-CM

## 2015-05-17 DIAGNOSIS — M1612 Unilateral primary osteoarthritis, left hip: Secondary | ICD-10-CM | POA: Diagnosis not present

## 2015-05-17 LAB — CBC
HEMATOCRIT: 32.6 % — AB (ref 36.0–46.0)
HEMOGLOBIN: 9.9 g/dL — AB (ref 12.0–15.0)
MCH: 22.2 pg — AB (ref 26.0–34.0)
MCHC: 30.4 g/dL (ref 30.0–36.0)
MCV: 73.3 fL — AB (ref 78.0–100.0)
Platelets: 258 10*3/uL (ref 150–400)
RBC: 4.45 MIL/uL (ref 3.87–5.11)
RDW: 15.9 % — AB (ref 11.5–15.5)
WBC: 4 10*3/uL (ref 4.0–10.5)

## 2015-05-17 LAB — COMPREHENSIVE METABOLIC PANEL
ALBUMIN: 3.4 g/dL — AB (ref 3.5–5.0)
ALK PHOS: 76 U/L (ref 38–126)
ALT: 15 U/L (ref 14–54)
ANION GAP: 8 (ref 5–15)
AST: 13 U/L — ABNORMAL LOW (ref 15–41)
BILIRUBIN TOTAL: 0.4 mg/dL (ref 0.3–1.2)
BUN: 17 mg/dL (ref 6–20)
CALCIUM: 9.3 mg/dL (ref 8.9–10.3)
CO2: 27 mmol/L (ref 22–32)
Chloride: 104 mmol/L (ref 101–111)
Creatinine, Ser: 1.04 mg/dL — ABNORMAL HIGH (ref 0.44–1.00)
GFR calc Af Amer: >60 mL/min (ref >60)
GFR, EST NON AFRICAN AMERICAN: 52 mL/min — AB (ref >60)
GLUCOSE: 130 mg/dL — AB (ref 65–99)
Potassium: 4 mmol/L (ref 3.5–5.1)
Sodium: 139 mmol/L (ref 135–145)
TOTAL PROTEIN: 6.9 g/dL (ref 6.5–8.1)

## 2015-05-17 LAB — GLUCOSE, CAPILLARY
GLUCOSE-CAPILLARY: 125 mg/dL — AB (ref 65–99)
GLUCOSE-CAPILLARY: 119 mg/dL — AB (ref 65–99)
Glucose-Capillary: 107 mg/dL — ABNORMAL HIGH (ref 65–99)
Glucose-Capillary: 125 mg/dL — ABNORMAL HIGH (ref 65–99)
Glucose-Capillary: 128 mg/dL — ABNORMAL HIGH (ref 65–99)

## 2015-05-17 MED ORDER — HYDROCODONE-ACETAMINOPHEN 5-325 MG PO TABS: 1.0000 | ORAL_TABLET | Freq: Four times a day (QID) | ORAL | Status: DC | PRN

## 2015-05-17 NOTE — Progress Notes (Signed)
Pt is medically stable [facility]. Pt / daughter are in agreement with d/c plan. [facility] transport is required. Medical necessity form completed. D/C summary, scripts, avs reviewed by NSG.  DC summary and FL2 sent to ALF for review prior to d/c.   Cori RazorJamie Verlee Pope LCSW [phone]

## 2015-05-17 NOTE — Discharge Summary (Signed)
Physician Discharge Summary  [name] ZOX:[id number] DOB: 10-12-1940 DOA: 05/16/2015  PCP: Mikki Harbor  Admit date: 05/16/2015 Discharge date: 05/17/2015  Time spent: >35 minutes  Recommendations for Outpatient Follow-up:  F/u with orthopedics, Dr. Eulah Pont as outpatient 2-3 weeks F/u with PCP in 1-2 weeks as needed  Discharge Diagnoses:  Principal Problem:   Dislocation of left hip [facility]) Active Problems:   Schizophrenia (HCC)   Essential hypertension   Dementia   Dislocation of hip (HCC)   Hip dysplasia, acquired   Discharge Condition: stable   Diet recommendation: DM  Filed Weights   05/16/15 1146  Weight: 90.8 kg (200 lb 2.8 oz)    History of present illness:  74 y/o female with PMH of HTN, Schizophrenia, DJD is admitted with L hip pains    Hospital Course:  1. L hip pain, DJD. 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 Marked flattening of the femoral head and chronic sclerosis/fragmentation. No conclusive acute findings.  -patient is evaluated by orthopedics, who recommended to f/u as outpatient for further management. ? Need complex surgery due to left hip dysplasia. We will cont pain control as tolerated. F/u with ortho in 1-2 weeks   2. HTN. Stable      Procedures:  CT hip  (i.e. Studies not automatically included, echos, thoracentesis, etc; not x-rays)  Consultations:  Orthopedics   Discharge Exam: Filed Vitals:   05/17/15 0859  BP: 130/62  Pulse: 71  Temp:   Resp:     General: alert. No distress  Cardiovascular: s1,s2 rrr Respiratory: CTA BL  Discharge Instructions  Discharge Instructions    Diet - low sodium heart healthy    Complete by:  As directed      Discharge instructions    Complete by:  As directed   Please follow up with orthopedic surgery in 2-3 weeks     Increase activity slowly    Complete by:  As directed             Medication List    TAKE these  medications        acetaminophen 500 MG tablet  Commonly known as:  TYLENOL  Take 1,000 mg by mouth 2 (two) times daily. Patient takes for arthritis pain.     ALPRAZolam 0.5 MG tablet  Commonly known as:  XANAX  Take 0.5 mg by mouth 3 (three) times daily as needed. For agitation/anxiety     aspirin 81 MG chewable tablet  Chew 81 mg by mouth daily.     benztropine 1 MG tablet  Commonly known as:  COGENTIN  Take 1 mg by mouth 2 (two) times daily.     diclofenac sodium 1 % Gel  Commonly known as:  VOLTAREN  Apply 2 g topically 2 (two) times daily. Applied to affected area on left knee     docusate sodium 100 MG capsule  Commonly known as:  COLACE  Take 100 mg by mouth 2 (two) times daily.     donepezil 10 MG tablet  Commonly known as:  ARICEPT  Take 10 mg by mouth at bedtime.     escitalopram 10 MG tablet  Commonly known as:  LEXAPRO  Take 10 mg by mouth daily.     ferrous sulfate 325 (65 FE) MG tablet  Take 325 mg by mouth 2 (two) times daily. anemia     gabapentin 100 MG capsule  Commonly known as:  NEURONTIN  Take 100  mg by mouth daily. Give daily at 3pm. Do not give with Risperidone.     HYDROcodone-acetaminophen 5-325 MG tablet  Commonly known as:  NORCO/VICODIN  Take 1-2 tablets by mouth every 6 (six) hours as needed for moderate pain.     lisinopril 10 MG tablet  Commonly known as:  PRINIVIL,ZESTRIL  Take 10 mg by mouth daily.     metFORMIN 500 MG tablet  Commonly known as:  GLUCOPHAGE  Take 500 mg by mouth 2 (two) times daily with a meal.     metoprolol tartrate 25 MG tablet  Commonly known as:  LOPRESSOR  Take 25 mg by mouth 2 (two) times daily.     omeprazole 20 MG capsule  Commonly known as:  PRILOSEC  Take 20 mg by mouth daily.     polyethylene glycol packet  Commonly known as:  MIRALAX / GLYCOLAX  Take 17 g by mouth daily.     risperiDONE 3 MG tablet  Commonly known as:  RISPERDAL  Take 3 mg by mouth 2 (two) times daily.     topiramate 50  MG tablet  Commonly known as:  TOPAMAX  Take 50 mg by mouth 2 (two) times daily.     vitamin C 500 MG tablet  Commonly known as:  ASCORBIC ACID  Take 500 mg by mouth 2 (two) times daily.       No Known Allergies     Follow-up Information    Follow up with Cheral AlmasXu, Naiping Michael, MD In 2 weeks.   Specialty:  Orthopedic Surgery   Why:  As needed   Contact information:   [address] [place] [address] [phone]       Follow up with Blanchard ManeMcGhee, James E In 1 week.   Specialty:  Family Medicine   Contact information:   [address] [place] [address] [phone]       Follow up with MURPHY, Jewel BaizeIMOTHY D, MD. Schedule an appointment as soon as possible for a visit in 2 weeks.   Specialty:  Orthopedic Surgery   Contact information:   [address] [place] [address] [phone]        The results of significant diagnostics from this hospitalization (including imaging, microbiology, ancillary and laboratory) are listed below for reference.    Significant Diagnostic Studies: Dg Chest 2 View  05/16/2015  CLINICAL DATA:  Shortness of breath for several days. Hurts all over. EXAM: CHEST  2 VIEW COMPARISON:  07/28/2009 FINDINGS: Shallow inspiration. Normal heart size and pulmonary vascularity. No focal airspace disease or consolidation in the lungs. No blunting of costophrenic angles. No pneumothorax. Degenerative changes in the spine. IMPRESSION: Shallow inspiration.  No evidence of active pulmonary disease Electronically Signed   By: Burman NievesWilliam  Stevens M.D.   On: 05/16/2015 03:08   Ct Hip Left Wo Contrast  05/16/2015  CLINICAL DATA:  Left hip pain.  No trauma. EXAM: CT OF THE LEFT HIP WITHOUT CONTRAST TECHNIQUE: Multidetector CT imaging of the left hip was performed according to the standard protocol. Multiplanar CT image reconstructions were also generated. COMPARISON:  Radiographs 05/16/2015 and earlier FINDINGS: There are severe chronic  arthropathic changes of the left hip. There is chronic fragmentation of the femoral head with marked sclerosis. There is flattening, sclerosis and subchondral degenerative cyst formation of the acetabulum. There is long-standing superior subluxation of the hip, with pseudoarticulation in the supra-acetabular region associated with the chronic flattening, chronic sclerosis and fragmentation. No conclusive acute fracture is present  although the numerous old fragments will reduce sensitivity. No bone lesion or mass is evident. No acute soft tissue abnormality is evident. 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 Marked flattening of the femoral head and chronic sclerosis/fragmentation. No conclusive acute findings. Electronically Signed   By: Ellery Plunk M.D.   On: 05/16/2015 04:22   Dg Knee Complete 4 Views Left  05/16/2015  CLINICAL DATA:  No known trauma.  Knee pain for several weeks. EXAM: LEFT KNEE - COMPLETE 4+ VIEW COMPARISON:  02/01/2014 FINDINGS: There is poor bone density, decreasing sensitivity for nondisplaced fracture. There is extensive chondrocalcinosis. There are intra-articular loose bodies. Moderate degenerative changes are present involving all compartments. No bone lesion or bony destruction. No significant change from 02/01/2014. IMPRESSION: Chondrocalcinosis and tricompartment degenerative changes. Intra-articular loose bodies. No conclusive acute findings. The poor bone density decreases sensitivity. Electronically Signed   By: Ellery Plunk M.D.   On: 05/16/2015 04:04   Dg Hip Unilat With Pelvis 2-3 Views Left  05/16/2015  CLINICAL DATA:  In painful left hip for over a week. Cannot bear weight. No specific injury. 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 Severe degenerative changes in the left hip with loss of joint space  and resorption and remodeling of the left femoral head. Heterotopic ossification is present. Appearance is unchanged since previous study. No evidence of acute fracture or dislocation. Degenerative changes present in the right hip and lower lumbar spine. Pelvis appears otherwise intact. SI joints and symphysis pubis are not displaced. Sacrum is obscured by bowel gas. IMPRESSION: Changes of left hip dysplasia with severe degenerative change in bone resorption/ remodeling of the femoral head. No acute bony abnormalities. Electronically Signed   By: Burman Nieves M.D.   On: 05/16/2015 03:07    Microbiology: No results found for this or any previous visit (from the past 240 hour(s)).   Labs: Basic Metabolic Panel:  Recent Labs Lab 05/16/15 0346 05/17/15 0425  NA 140 139  K 4.1 4.0  CL 101 104  CO2  --  27  GLUCOSE 105* 130*  BUN 13 17  CREATININE 0.90 1.04*  CALCIUM  --  9.3   Liver Function Tests:  Recent Labs Lab 05/17/15 0425  AST 13*  ALT 15  ALKPHOS 76  BILITOT 0.4  PROT 6.9  ALBUMIN 3.4*   No results for input(s): LIPASE, AMYLASE in the last 168 hours. No results for input(s): AMMONIA in the last 168 hours. CBC:  Recent Labs Lab 05/16/15 0338 05/16/15 0346 05/17/15 0425  WBC 5.3  --  4.0  NEUTROABS 2.3  --   --   HGB 10.9* 13.3 9.9*  HCT 35.4* 39.0 32.6*  MCV 73.0*  --  73.3*  PLT 268  --  258   Cardiac Enzymes: No results for input(s): CKTOTAL, CKMB, CKMBINDEX, TROPONINI in the last 168 hours. BNP: BNP (last 3 results) No results for input(s): BNP in the last 8760 hours.  ProBNP (last 3 results) No results for input(s): PROBNP in the last 8760 hours.  CBG:  Recent Labs Lab 05/16/15 1605 05/16/15 2003 05/17/15 0011 05/17/15 0420 05/17/15 0715  GLUCAP 113* 125* 125* 119* 128*       Signed:  Jonette Mate N  [facility] 05/17/2015, 10:26 AM

## 2015-05-17 NOTE — Care Management Obs Status (Signed)
MEDICARE OBSERVATION STATUS NOTIFICATION   Patient Details  Name: [name] MRN: [id number] Date of Birth: 1940-07-15   Medicare Observation Status Notification Given:  Yes    Alexis Goodelleele, Darnel Mchan K, RN 05/17/2015, 8:51 AM

## 2015-05-17 NOTE — Evaluation (Addendum)
Physical Therapy Evaluation Patient Details Name: [name] MRN: [id number] DOB: [birth year] Today's Date: 05/17/2015   History of Present Illness  This 74 year old female was admitted with worsening L hip pain. She has L OA, dysplasia and femoral head collapse.  Clinical Impression  Pt admitted as above and presenting with functional mobility limitations 2* L LE weakness and pain with WB, generalized weakness and obesity.  Pt will need follow up rehab at ALF level and will benefit from use of RW.    Follow Up Recommendations ALF    Equipment Recommendations  Rolling walker with 5" wheels (Youth level RW )    Recommendations for Other Services OT consult     Precautions / Restrictions Precautions Precautions: Fall Restrictions Weight Bearing Restrictions: No Other Position/Activity Restrictions: WBAT      Mobility  Bed Mobility Overal bed mobility: Needs Assistance Bed Mobility: Supine to Sit     Supine to sit: Mod assist     General bed mobility comments: cues for technique.  Min A for LLE and trunk; utilized pad to pull around  Transfers Overall transfer level: Needs assistance Equipment used: Rolling walker (2 wheeled) Transfers: Sit to/from Stand Sit to Stand: Mod assist Stand pivot transfers: Min assist;Mod assist;+2 safety/equipment       General transfer comment: assist to rise and stabilize; Cues for LE management and use of UEs to self assist  Ambulation/Gait Ambulation/Gait assistance: Min assist;Mod assist;+2 safety/equipment Ambulation Distance (Feet): 18 Feet Assistive device: Rolling walker (2 wheeled) Gait Pattern/deviations: Step-to pattern;Decreased step length - left;Decreased step length - right;Decreased stance time - left;Shuffle;Trunk flexed Gait velocity: decr   General Gait Details: cues for sequence, posture, position from RW and increased UE WB  Stairs            Wheelchair Mobility    Modified Rankin (Stroke  Patients Only)       Balance                                             Pertinent Vitals/Pain Pain Assessment: Faces Faces Pain Scale: Hurts little more Pain Location: Back of R knee at rest.  L hip with WB Pain Descriptors / Indicators: Aching;Cramping;Sore Pain Intervention(s): Limited activity within patient's tolerance;Monitored during session;Premedicated before session    Home Living Family/patient expects to be discharged to:: Assisted living                      Prior Function           Comments: unsure;  pt says she did own self care--has call light     Hand Dominance        Extremity/Trunk Assessment   Upper Extremity Assessment: Generalized weakness           Lower Extremity Assessment: Generalized weakness;LLE deficits/detail   LLE Deficits / Details: 2+/5 hip strength with ROM WFL  Cervical / Trunk Assessment: Normal  Communication   Communication: No difficulties  Cognition Arousal/Alertness: Awake/alert Behavior During Therapy: WFL for tasks assessed/performed Overall Cognitive Status: History of cognitive impairments - at baseline                      General Comments      Exercises        Assessment/Plan    PT Assessment Patient needs continued PT  services  PT Diagnosis Difficulty walking   PT Problem List Decreased strength;Decreased range of motion;Decreased activity tolerance;Decreased mobility;Decreased knowledge of use of DME;Decreased cognition;Obesity;Pain  PT Treatment Interventions DME instruction;Gait training;Functional mobility training;Therapeutic activities;Therapeutic exercise;Cognitive remediation;Patient/family education   PT Goals (Current goals can be found in the Care Plan section) Acute Rehab PT Goals Patient Stated Goal: less pain PT Goal Formulation: With patient Time For Goal Achievement: 05/24/15 Potential to Achieve Goals: Fair    Frequency Min 3X/week   Barriers  to discharge        Co-evaluation PT/OT/SLP Co-Evaluation/Treatment: Yes Reason for Co-Treatment: For patient/therapist safety PT goals addressed during session: Mobility/safety with mobility OT goals addressed during session: ADL's and self-care       End of Session Equipment Utilized During Treatment: Gait belt Activity Tolerance: Patient tolerated treatment well;Patient limited by pain Patient left: in chair;with call bell/phone within reach;with chair alarm set Nurse Communication: Mobility status    Functional Assessment Tool Used: clinical judgement Functional Limitation: Mobility: Walking and moving around Mobility: Walking and Moving Around Current Status [phone](G8978): At least 40 percent but less than 60 percent impaired, limited or restricted Mobility: Walking and Moving Around Goal Status [phone](G8979): At least 20 percent but less than 40 percent impaired, limited or restricted    Time: 0754-0823 PT Time Calculation (min) (ACUTE ONLY): 29 min   Charges:   PT Evaluation $Initial PT Evaluation Tier I: 1 Procedure     PT G Codes:   PT G-Codes **NOT FOR INPATIENT CLASS** Functional Assessment Tool Used: clinical judgement Functional Limitation: Mobility: Walking and moving around Mobility: Walking and Moving Around Current Status ([id number](G8978): At least 40 percent but less than 60 percent impaired, limited or restricted Mobility: Walking and Moving Around Goal Status [phone](G8979): At least 20 percent but less than 40 percent impaired, limited or restricted    Ashle Stief 05/17/2015, 10:43 AM

## 2015-05-17 NOTE — Progress Notes (Signed)
CSW assisting with d/c planning. Therapist recommend PT / OT at ALF. CSW will assist with making these arrangements with [facility].  Cori RazorJamie Jilliane Kazanjian LCSW [phone]

## 2015-05-17 NOTE — Evaluation (Signed)
Occupational Therapy Evaluation Patient Details Name: [name] MRN: [id number] DOB: 05/11/1941 Today's Date: 05/17/2015    History of Present Illness This 74 year old female was admitted with worsening L hip pain. She has L OA, dysplasia and femoral head collapse.   Clinical Impression   Pt was admitted for the above.  Unsure of PLOF--pt reports she did her own ADLs but unable to verify.  She needs overall mod A for transfers and LB adls. Goals in acute are for min guard to min A. Recommend continued OT at ALF    Follow Up Recommendations  Home health OT (assist for ADLs/mobility)    Equipment Recommendations  3 in 1 bedside comode    Recommendations for Other Services       Precautions / Restrictions Precautions Precautions: Fall Restrictions Other Position/Activity Restrictions: WBAT      Mobility Bed Mobility Overal bed mobility: Needs Assistance Bed Mobility: Supine to Sit     Supine to sit: Mod assist     General bed mobility comments: cues for technique.  Min A for LLE and trunk; utilized pad to pull around  Transfers Overall transfer level: Needs assistance Equipment used: Rolling walker (2 wheeled) Transfers: Sit to/from Stand Sit to Stand: Mod assist         General transfer comment: assist to rise and stabilize    Balance                                            ADL Overall ADL's : Needs assistance/impaired             Lower Body Bathing: Moderate assistance;Sit to/from stand   Upper Body Dressing : Moderate assistance;Standing       Toilet Transfer: Moderate assistance;Stand-pivot;BSC;RW (for sit to stand)   Toileting- ArchitectClothing Manipulation and Hygiene: Moderate assistance (for sit to stand; min A for activity)         General ADL Comments: pt is able to perform UB adls with set up     Vision     Perception     Praxis      Pertinent Vitals/Pain Pain Assessment: Faces Faces Pain Scale:  Hurts little more Pain Location: back of Right knee; Left hip not hurting Pain Descriptors / Indicators: Cramping Pain Intervention(s): Limited activity within patient's tolerance;Monitored during session;Premedicated before session;Repositioned (notified RN)     Hand Dominance     Extremity/Trunk Assessment Upper Extremity Assessment Upper Extremity Assessment: Generalized weakness (LUE utilizes biceps when lifting shoulder)           Communication Communication Communication: No difficulties   Cognition Arousal/Alertness: Awake/alert Behavior During Therapy: WFL for tasks assessed/performed Overall Cognitive Status: History of cognitive impairments - at baseline.  Pt follows commands with extra time; cues for safety                     General Comments       Exercises       Shoulder Instructions      Home Living Family/patient expects to be discharged to:: Assisted living                                        Prior Functioning/Environment          Comments:  unsure;  pt says she did own self care--has call light    OT Diagnosis: Acute pain;Generalized weakness   OT Problem List: Decreased strength;Decreased activity tolerance;Impaired balance (sitting and/or standing);Pain;Decreased cognition;Decreased knowledge of use of DME or AE   OT Treatment/Interventions: Self-care/ADL training;DME and/or AE instruction;Therapeutic activities;Patient/family education;Balance training;Cognitive remediation/compensation    OT Goals(Current goals can be found in the care plan section) Acute Rehab OT Goals Patient Stated Goal: less pain OT Goal Formulation: With patient Time For Goal Achievement: 05/24/15 Potential to Achieve Goals: Good ADL Goals Pt Will Perform Lower Body Bathing: with min assist;with adaptive equipment;sit to/from stand Pt Will Perform Lower Body Dressing: with min assist;with adaptive equipment;sit to/from stand Pt Will  Transfer to Toilet: with min assist;ambulating;bedside commode (for sit to stand; min guard for steps) Pt Will Perform Toileting - Clothing Manipulation and hygiene: with min guard assist;sit to/from stand (with min A for sit to stand)  OT Frequency: Min 2X/week   Barriers to D/C:            Co-evaluation PT/OT/SLP Co-Evaluation/Treatment: Yes Reason for Co-Treatment: For patient/therapist safety PT goals addressed during session: Mobility/safety with mobility OT goals addressed during session: ADL's and self-care      End of Session    Activity Tolerance: Patient tolerated treatment well Patient left: in chair;with call bell/phone within reach;with chair alarm set   Time: 0752-0821 OT Time Calculation (min): 29 min Charges:  OT General Charges $OT Visit: 1 Procedure OT Evaluation $Initial OT Evaluation Tier I: 1 Procedure G-Codes: OT G-codes **NOT FOR INPATIENT CLASS** Functional Assessment Tool Used: clinical judgment and observation Functional Limitation: Self care Self Care Current Status (J4782): At least 40 percent but less than 60 percent impaired, limited or restricted Self Care Goal Status (N5621): At least 20 percent but less than 40 percent impaired, limited or restricted  [name] 05/17/2015, 9:09 AM  Marica Otter, OTR/L [phone] 05/17/2015

## 2015-05-17 NOTE — [place] FL2 (Signed)
[place] MEDICAID FL2 LEVEL OF CARE SCREENING TOOL     IDENTIFICATION  Patient Name: [name] Birthdate: July 30, 1940 Sex: female Admission Date (Current Location): 05/16/2015  [facility] [place] [facility]     Facility and Address:  [facility],  [address], [place] [id number]      Provider Number: [id number]  Attending Physician Name and Address:  Esperanza SheetsUlugbek N Buriev, MD  Relative Name and Phone Number:       Current Level of Care: Hospital Recommended Level of Care: Assisted Living Facility Prior Approval Number:    Date Approved/Denied:   PASRR Number:    Discharge Plan: Other (Comment) (Assisted Living Facility)    Current Diagnoses: Patient Active Problem List   Diagnosis Date Noted  . Dislocation of left hip (HCC) 05/16/2015  . Hip dysplasia, acquired 05/16/2015  . Dislocation of hip (HCC)   . HTN (hypertension) 03/17/2012  . GERD (gastroesophageal reflux disease) 03/17/2012  . Dementia 03/17/2012  . Essential hypertension 03/14/2009  . Schizophrenia (HCC) 11/02/2008  . DIZZINESS 11/02/2008  . Headache(784.0) 11/02/2008    Orientation ACTIVITIES/SOCIAL BLADDER RESPIRATION    Self, Situation, Place  Active Incontinent Normal  BEHAVIORAL SYMPTOMS/MOOD NEUROLOGICAL BOWEL NUTRITION STATUS   (no behaviors)   Continent Diet  PHYSICIAN VISITS COMMUNICATION OF NEEDS Height & Weight Skin    Verbally 5\' 2"  (157.5 cm) 199 lbs. Other (Comment) (Oval scab on back.)          AMBULATORY STATUS RESPIRATION      Normal      Personal Care Assistance Level of Assistance  Bathing, Dressing Bathing Assistance: Limited assistance   Dressing Assistance: Limited assistance      Functional Limitations Info                SPECIAL CARE FACTORS FREQUENCY  PT (By licensed PT), OT (By licensed OT)     PT Frequency:  3 x wkly OT Frequency: 3 x wkly           Additional Factors Info  Code Status Code Status Info: Full Code             Current Medications (05/17/2015): Current Facility-Administered Medications  Medication Dose Route Frequency Provider Last Rate Last Dose  . acetaminophen (TYLENOL) tablet 1,000 mg  1,000 mg Oral BID Hillary BowJared M Gardner, DO   1,000 mg at 05/17/15 0858  . ALPRAZolam Prudy Feeler(XANAX) tablet 0.5 mg  0.5 mg Oral TID PRN Hillary BowJared M Gardner, DO      . benztropine (COGENTIN) tablet 1 mg  1 mg Oral BID Hillary BowJared M Gardner, DO   1 mg at 05/17/15 0858  . docusate sodium (COLACE) capsule 100 mg  100 mg Oral BID Hillary BowJared M Gardner, DO   100 mg at 05/17/15 [id number]  . donepezil (ARICEPT) tablet 10 mg  10 mg Oral QHS Hillary BowJared M Gardner, DO   10 mg at 05/16/15 2213  . enoxaparin (LOVENOX) injection 45 mg  0.5 mg/kg Subcutaneous Q24H Richarda OverlieNayana Abrol, MD   45 mg at 05/16/15 1503  . escitalopram (LEXAPRO) tablet 10 mg  10 mg Oral Daily Hillary BowJared M Gardner, DO   10 mg at 05/17/15 0859  . ferrous sulfate tablet 325 mg  325 mg Oral BID Hillary BowJared M Gardner, DO   325 mg at 05/17/15 [id number]  . gabapentin (NEURONTIN) capsule 100 mg  100 mg Oral Daily Hillary BowJared M Gardner, DO   100 mg at 05/17/15 0857  . HYDROcodone-acetaminophen (NORCO/VICODIN) 5-325 MG per tablet 1-2  tablet  1-2 tablet Oral Q6H PRN Hillary Bow, DO   1 tablet at 05/17/15 1610  . insulin aspart (novoLOG) injection 0-9 Units  0-9 Units Subcutaneous 6 times per day Hillary Bow, DO   2 Units at 05/17/15 0748  . lisinopril (PRINIVIL,ZESTRIL) tablet 10 mg  10 mg Oral Daily Hillary Bow, DO   10 mg at 05/17/15 0858  . metoprolol tartrate (LOPRESSOR) tablet 25 mg  25 mg Oral BID Hillary Bow, DO   25 mg at 05/17/15 0859  . morphine 2 MG/ML injection 0.5 mg  0.5 mg Intravenous Q2H PRN Hillary Bow, DO      . pantoprazole (PROTONIX) EC tablet 40 mg  40 mg Oral Daily Hillary Bow, DO   40 mg at 05/17/15 0858  . polyethylene glycol (MIRALAX / GLYCOLAX) packet 17 g  17 g Oral Daily Hillary Bow, DO   17 g at 05/16/15 1100  . risperiDONE (RISPERDAL) tablet 3 mg  3 mg Oral BID Hillary Bow, DO   3 mg at 05/17/15 0858  . topiramate (TOPAMAX) tablet 50 mg  50 mg Oral BID Hillary Bow, DO   50 mg at 05/17/15 9604   Do not use this list as official medication orders. Please verify with discharge summary.  Discharge Medications:   Medication List    TAKE these medications        acetaminophen 500 MG tablet  Commonly known as:  TYLENOL  Take 1,000 mg by mouth 2 (two) times daily. Patient takes for arthritis pain.     ALPRAZolam 0.5 MG tablet  Commonly known as:  XANAX  Take 0.5 mg by mouth 3 (three) times daily as needed. For agitation/anxiety     aspirin 81 MG chewable tablet  Chew 81 mg by mouth daily.     benztropine 1 MG tablet  Commonly known as:  COGENTIN  Take 1 mg by mouth 2 (two) times daily.     diclofenac sodium 1 % Gel  Commonly known as:  VOLTAREN  Apply 2 g topically 2 (two) times daily. Applied to affected area on left knee     docusate sodium 100 MG capsule  Commonly known as:  COLACE  Take 100 mg by mouth 2 (two) times daily.     donepezil 10 MG tablet  Commonly known as:  ARICEPT  Take 10 mg by mouth at bedtime.     escitalopram 10 MG tablet  Commonly known as:  LEXAPRO  Take 10 mg by mouth daily.     ferrous sulfate 325 (65 FE) MG tablet  Take 325 mg by mouth 2 (two) times daily. anemia     gabapentin 100 MG capsule  Commonly known as:  NEURONTIN  Take 100 mg by mouth daily. Give daily at 3pm. Do not give with Risperidone.     HYDROcodone-acetaminophen 5-325 MG tablet  Commonly known as:  NORCO/VICODIN  Take 1-2 tablets by mouth every 6 (six) hours as needed for moderate pain.     lisinopril 10 MG tablet  Commonly known as:  PRINIVIL,ZESTRIL  Take 10 mg by mouth daily.     metFORMIN 500 MG tablet  Commonly known as:  GLUCOPHAGE  Take 500 mg by mouth 2 (two) times daily with a meal.     metoprolol tartrate 25 MG tablet  Commonly known as:  LOPRESSOR  Take 25 mg by mouth 2 (two) times daily.     omeprazole 20 MG  capsule  Commonly known as:  PRILOSEC  Take 20 mg by mouth daily.     polyethylene glycol packet  Commonly known as:  MIRALAX / GLYCOLAX  Take 17 g by mouth daily.     risperiDONE 3 MG tablet  Commonly known as:  RISPERDAL  Take 3 mg by mouth 2 (two) times daily.     topiramate 50 MG tablet  Commonly known as:  TOPAMAX  Take 50 mg by mouth 2 (two) times daily.     vitamin C 500 MG tablet  Commonly known as:  ASCORBIC ACID  Take 500 mg by mouth 2 (two) times daily.        Relevant Imaging Results:  Relevant Lab Results:  Recent Labs    Additional Information PT and OT services are needed at ALF to improve mobility and participation in ADLs.  Joie Hipps, Dickey Gave, LCSW

## 2015-12-26 ENCOUNTER — Encounter (HOSPITAL_COMMUNITY): Payer: Self-pay

## 2015-12-26 ENCOUNTER — Emergency Department (HOSPITAL_COMMUNITY)
Admission: EM | Admit: 2015-12-26 | Discharge: 2015-12-26 | Disposition: A | Discharge status: Home / Self Care | Payer: Medicare Other | Attending: Emergency Medicine | Admitting: Emergency Medicine

## 2015-12-26 DIAGNOSIS — F209 Schizophrenia, unspecified: Secondary | ICD-10-CM | POA: Insufficient documentation

## 2015-12-26 DIAGNOSIS — R6889 Other general symptoms and signs: Secondary | ICD-10-CM

## 2015-12-26 DIAGNOSIS — Z79899 Other long term (current) drug therapy: Secondary | ICD-10-CM | POA: Insufficient documentation

## 2015-12-26 DIAGNOSIS — F450 Somatization disorder: Secondary | ICD-10-CM | POA: Insufficient documentation

## 2015-12-26 DIAGNOSIS — I10 Essential (primary) hypertension: Secondary | ICD-10-CM | POA: Insufficient documentation

## 2015-12-26 DIAGNOSIS — D509 Iron deficiency anemia, unspecified: Secondary | ICD-10-CM | POA: Insufficient documentation

## 2015-12-26 DIAGNOSIS — J45909 Unspecified asthma, uncomplicated: Secondary | ICD-10-CM | POA: Insufficient documentation

## 2015-12-26 DIAGNOSIS — Z7982 Long term (current) use of aspirin: Secondary | ICD-10-CM | POA: Diagnosis not present

## 2015-12-26 DIAGNOSIS — N390 Urinary tract infection, site not specified: Secondary | ICD-10-CM | POA: Diagnosis not present

## 2015-12-26 DIAGNOSIS — F419 Anxiety disorder, unspecified: Secondary | ICD-10-CM | POA: Diagnosis present

## 2015-12-26 DIAGNOSIS — Z7984 Long term (current) use of oral hypoglycemic drugs: Secondary | ICD-10-CM | POA: Insufficient documentation

## 2015-12-26 DIAGNOSIS — M1990 Unspecified osteoarthritis, unspecified site: Secondary | ICD-10-CM | POA: Diagnosis not present

## 2015-12-26 DIAGNOSIS — E119 Type 2 diabetes mellitus without complications: Secondary | ICD-10-CM | POA: Diagnosis not present

## 2015-12-26 LAB — COMPREHENSIVE METABOLIC PANEL
ALBUMIN: 4.1 g/dL (ref 3.5–5.0)
ALT: 17 U/L (ref 14–54)
ANION GAP: 9 (ref 5–15)
AST: 17 U/L (ref 15–41)
Alkaline Phosphatase: 84 U/L (ref 38–126)
BILIRUBIN TOTAL: 0.3 mg/dL (ref 0.3–1.2)
BUN: 14 mg/dL (ref 6–20)
CALCIUM: 9 mg/dL (ref 8.9–10.3)
CO2: 23 mmol/L (ref 22–32)
Chloride: 101 mmol/L (ref 101–111)
Creatinine, Ser: 0.88 mg/dL (ref 0.44–1.00)
Glucose, Bld: 109 mg/dL — ABNORMAL HIGH (ref 65–99)
POTASSIUM: 4 mmol/L (ref 3.5–5.1)
Sodium: 133 mmol/L — ABNORMAL LOW (ref 135–145)
Total Protein: 7.7 g/dL (ref 6.5–8.1)

## 2015-12-26 LAB — URINE MICROSCOPIC-ADD ON: RBC / HPF: NONE SEEN RBC/hpf (ref 0–5)

## 2015-12-26 LAB — CBC WITH DIFFERENTIAL/PLATELET
Basophils Absolute: 0 10*3/uL (ref 0.0–0.1)
Basophils Relative: 0 %
EOS ABS: 0.3 10*3/uL (ref 0.0–0.7)
Eosinophils Relative: 5 %
HCT: 33.1 % — ABNORMAL LOW (ref 36.0–46.0)
Hemoglobin: 10.4 g/dL — ABNORMAL LOW (ref 12.0–15.0)
Lymphocytes Relative: 41 %
Lymphs Abs: 2.1 10*3/uL (ref 0.7–4.0)
MCH: 22.5 pg — ABNORMAL LOW (ref 26.0–34.0)
MCHC: 31.4 g/dL (ref 30.0–36.0)
MCV: 71.5 fL — ABNORMAL LOW (ref 78.0–100.0)
MONO ABS: 0.5 10*3/uL (ref 0.1–1.0)
MONOS PCT: 10 %
Neutro Abs: 2.3 10*3/uL (ref 1.7–7.7)
Neutrophils Relative %: 44 %
PLATELETS: 230 10*3/uL (ref 150–400)
RBC: 4.63 MIL/uL (ref 3.87–5.11)
RDW: 15.5 % (ref 11.5–15.5)
WBC: 5.2 10*3/uL (ref 4.0–10.5)

## 2015-12-26 LAB — URINALYSIS, ROUTINE W REFLEX MICROSCOPIC
Bilirubin Urine: NEGATIVE
Glucose, UA: NEGATIVE mg/dL
Hgb urine dipstick: NEGATIVE
KETONES UR: NEGATIVE mg/dL
NITRITE: NEGATIVE
PH: 6.5 (ref 5.0–8.0)
PROTEIN: NEGATIVE mg/dL
Specific Gravity, Urine: 1.01 (ref 1.005–1.030)

## 2015-12-26 MED ORDER — ACETAMINOPHEN 325 MG PO TABS
650.0000 mg | ORAL_TABLET | Freq: Once | ORAL | Status: AC
  Administered 2015-12-26: 650 mg via ORAL
  Filled 2015-12-26: qty 2

## 2015-12-26 MED ORDER — LORAZEPAM 1 MG PO TABS: 1.0000 mg | ORAL_TABLET | Freq: Three times a day (TID) | ORAL | Status: DC | PRN

## 2015-12-26 MED ORDER — LORAZEPAM 1 MG PO TABS
1.0000 mg | ORAL_TABLET | Freq: Once | ORAL | Status: AC
  Administered 2015-12-26: 1 mg via ORAL
  Filled 2015-12-26: qty 1

## 2015-12-26 MED ORDER — NITROFURANTOIN MONOHYD MACRO 100 MG PO CAPS
100.0000 mg | ORAL_CAPSULE | Freq: Once | ORAL | Status: AC
  Administered 2015-12-26: 100 mg via ORAL
  Filled 2015-12-26: qty 1

## 2015-12-26 MED ORDER — NITROFURANTOIN MONOHYD MACRO 100 MG PO CAPS: 100.0000 mg | ORAL_CAPSULE | Freq: Two times a day (BID) | ORAL | Status: DC

## 2015-12-26 NOTE — ED Notes (Signed)
Pt BIB EMS [facility]. C/o headache (describes as a dull ache in her temples) for the last 12 hrs. Pt took tylenol with no relief. Pt uses a wheelchair at home- is able to stand and pivot. Denies vision changes, dizziness, nausea, or hitting her head. A&Ox4

## 2015-12-26 NOTE — ED Notes (Signed)
Bed: [id number] Expected date:  Expected time:  Means of arrival:  Comments: 75 yr old headache

## 2015-12-26 NOTE — Discharge Instructions (Signed)
Generalized Anxiety Disorder Generalized anxiety disorder (GAD) is a mental disorder. It interferes with life functions, including relationships, work, and school. GAD is different from normal anxiety, which everyone experiences at some point in their lives in response to specific life events and activities. Normal anxiety actually helps [place] prepare for and get through these life events and activities. Normal anxiety goes away after the event or activity is over.  GAD causes anxiety that is not necessarily related to specific events or activities. It also causes excess anxiety in proportion to specific events or activities. The anxiety associated with GAD is also difficult to control. GAD can vary from mild to severe. People with severe GAD can have intense waves of anxiety with physical symptoms (panic attacks).  SYMPTOMS The anxiety and worry associated with GAD are difficult to control. This anxiety and worry are related to many life events and activities and also occur more days than not for 6 months or longer. People with GAD also have three or more of the following symptoms (one or more in children):  Restlessness.   Fatigue.  Difficulty concentrating.   Irritability.  Muscle tension.  Difficulty sleeping or unsatisfying sleep. DIAGNOSIS GAD is diagnosed through an assessment by your health care provider. Your health care provider will ask you questions aboutyour mood,physical symptoms, and events in your life. Your health care provider may ask you about your medical history and use of alcohol or drugs, including prescription medicines. Your health care provider may also do a physical exam and blood tests. Certain medical conditions and the use of certain substances can cause symptoms similar to those associated with GAD. Your health care provider may refer you to a mental health specialist for further evaluation. TREATMENT The following therapies are usually used to treat GAD:    Medication. Antidepressant medication usually is prescribed for long-term daily control. Antianxiety medicines may be added in severe cases, especially when panic attacks occur.   Talk therapy (psychotherapy). Certain types of talk therapy can be helpful in treating GAD by providing support, education, and guidance. A form of talk therapy called cognitive behavioral therapy can teach you healthy ways to think about and react to daily life events and activities.  Stress managementtechniques. These include yoga, meditation, and exercise and can be very helpful when they are practiced regularly. A mental health specialist can help determine which treatment is best for you. Some people see improvement with one therapy. However, other people require a combination of therapies.   This information is not intended to replace advice given to you by your health care provider. Make sure you discuss any questions you have with your health care provider.   Document Released: 10/24/2012 Document Revised: 07/20/2014 Document Reviewed: 10/24/2012 Elsevier Interactive Patient Education 2016 Elsevier Inc.  Urinary Tract Infection Urinary tract infections (UTIs) can develop anywhere along your urinary tract. Your urinary tract is your body's drainage system for removing wastes and extra water. Your urinary tract includes two kidneys, two ureters, a bladder, and a urethra. Your kidneys are a pair of bean-shaped organs. Each kidney is about the size of your fist. They are located below your ribs, one on each side of your spine. CAUSES Infections are caused by microbes, which are microscopic organisms, including fungi, viruses, and bacteria. These organisms are so small that they can only be seen through a microscope. Bacteria are the microbes that most commonly cause UTIs. SYMPTOMS  Symptoms of UTIs may vary by age and gender of the patient and  by the location of the infection. Symptoms in young women typically  include a frequent and intense urge to urinate and a painful, burning feeling in the bladder or urethra during urination. Older women and men are more likely to be tired, shaky, and weak and have muscle aches and abdominal pain. A fever may mean the infection is in your kidneys. Other symptoms of a kidney infection include pain in your back or sides below the ribs, nausea, and vomiting. DIAGNOSIS To diagnose a UTI, your caregiver will ask you about your symptoms. Your caregiver will also ask you to provide a urine sample. The urine sample will be tested for bacteria and white blood cells. White blood cells are made by your body to help fight infection. TREATMENT  Typically, UTIs can be treated with medication. Because most UTIs are caused by a bacterial infection, they usually can be treated with the use of antibiotics. The choice of antibiotic and length of treatment depend on your symptoms and the type of bacteria causing your infection. HOME CARE INSTRUCTIONS  If you were prescribed antibiotics, take them exactly as your caregiver instructs you. Finish the medication even if you feel better after you have only taken some of the medication.  Drink enough water and fluids to keep your urine clear or pale yellow.  Avoid caffeine, tea, and carbonated beverages. They tend to irritate your bladder.  Empty your bladder often. Avoid holding urine for long periods of time.  Empty your bladder before and after sexual intercourse.  After a bowel movement, women should cleanse from front to back. Use each tissue only once. SEEK MEDICAL CARE IF:   You have back pain.  You develop a fever.  Your symptoms do not begin to resolve within 3 days. SEEK IMMEDIATE MEDICAL CARE IF:   You have severe back pain or lower abdominal pain.  You develop chills.  You have nausea or vomiting.  You have continued burning or discomfort with urination. MAKE SURE YOU:   Understand these instructions.  Will  watch your condition.  Will get help right away if you are not doing well or get worse.   This information is not intended to replace advice given to you by your health care provider. Make sure you discuss any questions you have with your health care provider.   Document Released: 04/08/2005 Document Revised: 03/20/2015 Document Reviewed: 08/07/2011 Elsevier Interactive Patient Education 2016 Elsevier Inc.  Nitrofurantoin tablets or capsules What is this medicine? NITROFURANTOIN (nye troe fyoor AN toyn) is an antibiotic. It is used to treat urinary tract infections. This medicine may be used for other purposes; ask your health care provider or pharmacist if you have questions. What should I tell my health care provider before I take this medicine? They need to know if you have any of these conditions: -anemia -diabetes -glucose-6-phosphate dehydrogenase deficiency -kidney disease -liver disease -lung disease -other chronic illness -an unusual or allergic reaction to nitrofurantoin, other antibiotics, other medicines, foods, dyes or preservatives -pregnant or trying to get pregnant -breast-feeding How should I use this medicine? Take this medicine by mouth with a glass of water. Follow the directions on the prescription label. Take this medicine with food or milk. Take your doses at regular intervals. Do not take your medicine more often than directed. Do not stop taking except on your doctor's advice. Talk to your pediatrician regarding the use of this medicine in children. While this drug may be prescribed for selected conditions, precautions do apply. Overdosage:  If you think you have taken too much of this medicine contact a poison control center or emergency room at once. NOTE: This medicine is only for you. Do not share this medicine with others. What if I miss a dose? If you miss a dose, take it as soon as you can. If it is almost time for your next dose, take only that dose. Do  not take double or extra doses. What may interact with this medicine? -antacids containing magnesium trisilicate -probenecid -quinolone antibiotics like ciprofloxacin, lomefloxacin, norfloxacin and ofloxacin -sulfinpyrazone This list may not describe all possible interactions. Give your health care provider a list of all the medicines, herbs, non-prescription drugs, or dietary supplements you use. Also tell them if you smoke, drink alcohol, or use illegal drugs. Some items may interact with your medicine. What should I watch for while using this medicine? Tell your doctor or health care professional if your symptoms do not improve or if you get new symptoms. Drink several glasses of water a day. If you are taking this medicine for a long time, visit your doctor for regular checks on your progress. If you are diabetic, you may get a false positive result for sugar in your urine with certain brands of urine tests. Check with your doctor. What side effects may I notice from receiving this medicine? Side effects that you should report to your doctor or health care professional as soon as possible: -allergic reactions like skin rash or hives, swelling of the face, lips, or tongue -chest pain -cough -difficulty breathing -dizziness, drowsiness -fever or infection -joint aches or pains -pale or blue-tinted skin -redness, blistering, peeling or loosening of the skin, including inside the mouth -tingling, burning, pain, or numbness in hands or feet -unusual bleeding or bruising -unusually weak or tired -yellowing of eyes or skin Side effects that usually do not require medical attention (report to your doctor or health care professional if they continue or are bothersome): -dark urine -diarrhea -headache -loss of appetite -nausea or vomiting -temporary hair loss This list may not describe all possible side effects. Call your doctor for medical advice about side effects. You may report side  effects to FDA at 1-800-FDA-1088. Where should I keep my medicine? Keep out of the reach of children. Store at room temperature between 15 and 30 degrees C (59 and 86 degrees F). Protect from light. Throw away any unused medicine after the expiration date. NOTE: This sheet is a summary. It may not cover all possible information. If you have questions about this medicine, talk to your doctor, pharmacist, or health care provider.    2016, Elsevier/Gold Standard. (2008-01-18 15:56:47)  Lorazepam tablets What is this medicine? LORAZEPAM (lor A ze pam) is a benzodiazepine. It is used to treat anxiety. This medicine may be used for other purposes; ask your health care provider or pharmacist if you have questions. What should I tell my health care provider before I take this medicine? They need to know if you have any of these conditions: -alcohol or drug abuse problem -bipolar disorder, depression, psychosis or other mental health condition -glaucoma -kidney or liver disease -lung disease or breathing difficulties -myasthenia gravis -Parkinson's disease -seizures or a history of seizures -suicidal thoughts -an unusual or allergic reaction to lorazepam, other benzodiazepines, foods, dyes, or preservatives -pregnant or trying to get pregnant -breast-feeding How should I use this medicine? Take this medicine by mouth with a glass of water. Follow the directions on the prescription label. If it upsets  your stomach, take it with food or milk. Take your medicine at regular intervals. Do not take it more often than directed. Do not stop taking except on the advice of your doctor or health care professional. Talk to your pediatrician regarding the use of this medicine in children. Special care may be needed. Overdosage: If you think you have taken too much of this medicine contact a poison control center or emergency room at once. NOTE: This medicine is only for you. Do not share this medicine with  others. What if I miss a dose? If you miss a dose, take it as soon as you can. If it is almost time for your next dose, take only that dose. Do not take double or extra doses. What may interact with this medicine? -barbiturate medicines for inducing sleep or treating seizures, like phenobarbital -clozapine -medicines for depression, mental problems or psychiatric disturbances -medicines for sleep -phenytoin -probenecid -theophylline -valproic acid This list may not describe all possible interactions. Give your health care provider a list of all the medicines, herbs, non-prescription drugs, or dietary supplements you use. Also tell them if you smoke, drink alcohol, or use illegal drugs. Some items may interact with your medicine. What should I watch for while using this medicine? Visit your doctor or health care professional for regular checks on your progress. Your body may become dependent on this medicine, ask your doctor or health care professional if you still need to take it. However, if you have been taking this medicine regularly for some time, do not suddenly stop taking it. You must gradually reduce the dose or you may get severe side effects. Ask your doctor or health care professional for advice before increasing or decreasing the dose. Even after you stop taking this medicine it can still affect your body for several days. You may get drowsy or dizzy. Do not drive, use machinery, or do anything that needs mental alertness until you know how this medicine affects you. To reduce the risk of dizzy and fainting spells, do not stand or sit up quickly, especially if you are an older patient. Alcohol may increase dizziness and drowsiness. Avoid alcoholic drinks. Do not treat yourself for coughs, colds or allergies without asking your doctor or health care professional for advice. Some ingredients can increase possible side effects. What side effects may I notice from receiving this  medicine? Side effects that you should report to your doctor or health care professional as soon as possible: -changes in vision -confusion -depression -mood changes, excitability or aggressive behavior -movement difficulty, staggering or jerky movements -muscle cramps -restlessness -weakness or tiredness Side effects that usually do not require medical attention (report to your doctor or health care professional if they continue or are bothersome): -constipation or diarrhea -difficulty sleeping, nightmares -dizziness, drowsiness -headache -nausea, vomiting This list may not describe all possible side effects. Call your doctor for medical advice about side effects. You may report side effects to FDA at 1-800-FDA-1088. Where should I keep my medicine? Keep out of the reach of children. This medicine can be abused. Keep your medicine in a safe place to protect it from theft. Do not share this medicine with anyone. Selling or giving away this medicine is dangerous and against the law. This medicine may cause accidental overdose and death if taken by other adults, children, or pets. Mix any unused medicine with a substance like cat litter or coffee grounds. Then throw the medicine away in a sealed container like a sealed  bag or a coffee can with a lid. Do not use the medicine after the expiration date. Store at room temperature between 20 and 25 degrees C (68 and 77 degrees F). Protect from light. Keep container tightly closed. NOTE: This sheet is a summary. It may not cover all possible information. If you have questions about this medicine, talk to your doctor, pharmacist, or health care provider.    2016, Elsevier/Gold Standard. (2014-03-20 15:24:21)

## 2015-12-26 NOTE — ED Provider Notes (Signed)
CSN: [id number]     Arrival date & time 12/26/15  0009 History  By signing my name below, I, Emmanuella Mensah, attest that this documentation has been prepared under the direction and in the presence of Dione Boozeavid Keveon Amsler, MD. Electronically Signed: Angelene GiovanniEmmanuella Mensah, ED Scribe. 12/26/2015. 2:33 AM.     Chief Complaint  Patient presents with  . Headache   Patient is a 75 y.o. female presenting with anxiety. The history is provided by the patient. No language interpreter was used.  Anxiety This is a new problem. The current episode started 12 to 24 hours ago. The problem occurs rarely. The problem has been gradually worsening. Pertinent negatives include no chest pain and no shortness of breath. Nothing aggravates the symptoms. Nothing relieves the symptoms. She has tried nothing for the symptoms.   HPI Comments: [name] is a 75 y.o. female with a hx of DM and HTN brought in by ambulance, who presents to the Emergency Department complaining of nervousness onset today. Pt states that she received a letter about her medication and has been nervous about that. She explains that she believes she needs to go to a mental institution for the nervousness. Pt also complains of onoing left leg pain although she was not able to state onset of pain. No alleviating factors noted. She denies any SI/HI.   Past Medical History  Diagnosis Date  . Diabetes mellitus   . Poor historian   . Schizophrenia, schizo-affective (HCC)   . Anemia   . Tachycardia   . Asthma   . Shortness of breath on exertion   . Rectal bleeding   . Cervical stenosis of spine   . Hypertension   . Arthritis    Past Surgical History  Procedure Laterality Date  . Cesarean section      x4   History reviewed. No pertinent family history. Social History  Substance Use Topics  . Smoking status: Never Smoker   . Smokeless tobacco: Never Used  . Alcohol Use: No   OB History    No data available     Review of Systems   Constitutional: Negative for fever.  Respiratory: Negative for shortness of breath.   Cardiovascular: Negative for chest pain.  Musculoskeletal: Positive for arthralgias.  Psychiatric/Behavioral: Negative for suicidal ideas, hallucinations and self-injury. The patient is nervous/anxious.   All other systems reviewed and are negative.     Allergies  Review of patient's allergies indicates no known allergies.  Home Medications   Prior to Admission medications   Medication Sig Start Date End Date Taking? Authorizing Provider  acetaminophen (TYLENOL) 500 MG tablet Take 1,000 mg by mouth 2 (two) times daily. Patient takes for arthritis pain.    Historical Provider, MD  ALPRAZolam Prudy Feeler(XANAX) 0.5 MG tablet Take 0.5 mg by mouth 3 (three) times daily as needed. For agitation/anxiety    Historical Provider, MD  aspirin 81 MG chewable tablet Chew 81 mg by mouth daily.    Historical Provider, MD  benztropine (COGENTIN) 1 MG tablet Take 1 mg by mouth 2 (two) times daily.    Historical Provider, MD  diclofenac sodium (VOLTAREN) 1 % GEL Apply 2 g topically 2 (two) times daily. Applied to affected area on left knee    Historical Provider, MD  docusate sodium (COLACE) 100 MG capsule Take 100 mg by mouth 2 (two) times daily.    Historical Provider, MD  donepezil (ARICEPT) 10 MG tablet Take 10 mg by mouth at bedtime.    Historical  Provider, MD  escitalopram (LEXAPRO) 10 MG tablet Take 10 mg by mouth daily.    Historical Provider, MD  ferrous sulfate 325 (65 FE) MG tablet Take 325 mg by mouth 2 (two) times daily. anemia    Historical Provider, MD  gabapentin (NEURONTIN) 100 MG capsule Take 100 mg by mouth daily. Give daily at 3pm. Do not give with Risperidone.    Historical Provider, MD  HYDROcodone-acetaminophen (NORCO/VICODIN) 5-325 MG tablet Take 1-2 tablets by mouth every 6 (six) hours as needed for moderate pain. 05/17/15   Esperanza Sheets, MD  lisinopril (PRINIVIL,ZESTRIL) 10 MG tablet Take 10 mg by  mouth daily.    Historical Provider, MD  metFORMIN (GLUCOPHAGE) 500 MG tablet Take 500 mg by mouth 2 (two) times daily with a meal.    Historical Provider, MD  metoprolol tartrate (LOPRESSOR) 25 MG tablet Take 25 mg by mouth 2 (two) times daily.    Historical Provider, MD  omeprazole (PRILOSEC) 20 MG capsule Take 20 mg by mouth daily.    Historical Provider, MD  polyethylene glycol (MIRALAX / GLYCOLAX) packet Take 17 g by mouth daily.    Historical Provider, MD  risperiDONE (RISPERDAL) 3 MG tablet Take 3 mg by mouth 2 (two) times daily.    Historical Provider, MD  topiramate (TOPAMAX) 50 MG tablet Take 50 mg by mouth 2 (two) times daily.    Historical Provider, MD  vitamin C (ASCORBIC ACID) 500 MG tablet Take 500 mg by mouth 2 (two) times daily.    Historical Provider, MD   BP 106/53 mmHg  Pulse 79  Temp(Src) 98.1 F (36.7 C) (Oral)  Resp 18  SpO2 99% Physical Exam  Constitutional: She is oriented to person, place, and time. She appears well-developed and well-nourished.  HENT:  Head: Normocephalic and atraumatic.  Eyes: EOM are normal. Pupils are equal, round, and reactive to light.  Neck: Normal range of motion. Neck supple. No JVD present.  Cardiovascular: Normal rate, regular rhythm and normal heart sounds.   No murmur heard. Pulmonary/Chest: Effort normal and breath sounds normal. She has no wheezes. She has no rales. She exhibits no tenderness.  Abdominal: Soft. Bowel sounds are normal. She exhibits no distension and no mass. There is no tenderness.  Musculoskeletal: Normal range of motion. She exhibits no edema.  Lymphadenopathy:    She has no cervical adenopathy.  Neurological: She is alert and oriented to person, place, and time. No cranial nerve deficit. She exhibits normal muscle tone. Coordination normal.  Skin: Skin is warm and dry. No rash noted.  Psychiatric: She has a normal mood and affect. Her behavior is normal. Thought content normal.  Nursing note and vitals  reviewed.   ED Course  Procedures (including critical care time) DIAGNOSTIC STUDIES: Oxygen Saturation is 99% on RA, normal by my interpretation.    COORDINATION OF CARE: 2:32 AM- Pt advised of plan for treatment and pt agrees. Pt will receive lab work for further evaluation. She will receive Tylenol and Ativan. Assured pt that she does not currently belong in a mental institution.   Labs Review Results for orders placed or performed during the hospital encounter of 12/26/15  Comprehensive metabolic panel  Result Value Ref Range   Sodium 133 (L) 135 - 145 mmol/L   Potassium 4.0 3.5 - 5.1 mmol/L   Chloride 101 101 - 111 mmol/L   CO2 23 22 - 32 mmol/L   Glucose, Bld 109 (H) 65 - 99 mg/dL   BUN 14 6 -  20 mg/dL   Creatinine, Ser 1.61 0.44 - 1.00 mg/dL   Calcium 9.0 8.9 - 09.6 mg/dL   Total Protein 7.7 6.5 - 8.1 g/dL   Albumin 4.1 3.5 - 5.0 g/dL   AST 17 15 - 41 U/L   ALT 17 14 - 54 U/L   Alkaline Phosphatase 84 38 - 126 U/L   Total Bilirubin 0.3 0.3 - 1.2 mg/dL   GFR calc non Af Amer >60 >60 mL/min   GFR calc Af Amer >60 >60 mL/min   Anion gap 9 5 - 15  Urinalysis, Routine w reflex microscopic  Result Value Ref Range   Color, Urine YELLOW YELLOW   APPearance CLOUDY (A) CLEAR   Specific Gravity, Urine 1.010 1.005 - 1.030   pH 6.5 5.0 - 8.0   Glucose, UA NEGATIVE NEGATIVE mg/dL   Hgb urine dipstick NEGATIVE NEGATIVE   Bilirubin Urine NEGATIVE NEGATIVE   Ketones, ur NEGATIVE NEGATIVE mg/dL   Protein, ur NEGATIVE NEGATIVE mg/dL   Nitrite NEGATIVE NEGATIVE   Leukocytes, UA MODERATE (A) NEGATIVE  Urine microscopic-add on  Result Value Ref Range   Squamous Epithelial / LPF 0-5 (A) NONE SEEN   WBC, UA 6-30 0 - 5 WBC/hpf   RBC / HPF NONE SEEN 0 - 5 RBC/hpf   Bacteria, UA MANY (A) NONE SEEN  CBC with Differential/Platelet  Result Value Ref Range   WBC 5.2 4.0 - 10.5 K/uL   RBC 4.63 3.87 - 5.11 MIL/uL   Hemoglobin 10.4 (L) 12.0 - 15.0 g/dL   HCT 04.5 (L) 40.9 - 81.1 %   MCV  71.5 (L) 78.0 - 100.0 fL   MCH 22.5 (L) 26.0 - 34.0 pg   MCHC 31.4 30.0 - 36.0 g/dL   RDW 91.4 78.2 - 95.6 %   Platelets 230 150 - 400 K/uL   Neutrophils Relative % 44 %   Neutro Abs 2.3 1.7 - 7.7 K/uL   Lymphocytes Relative 41 %   Lymphs Abs 2.1 0.7 - 4.0 K/uL   Monocytes Relative 10 %   Monocytes Absolute 0.5 0.1 - 1.0 K/uL   Eosinophils Relative 5 %   Eosinophils Absolute 0.3 0.0 - 0.7 K/uL   Basophils Relative 0 %   Basophils Absolute 0.0 0.0 - 0.1 K/uL     Dione Booze, MD has personally reviewed and evaluated these lab results as part of his medical decision-making.   MDM   Final diagnoses:  Anxiety  Urinary tract infection without hematuria, site unspecified  Multiple somatic complaints    Various somatic complaints which seem to be focused around anxiety. No concerning physical findings. Screening labs were obtained and were significant for mild anemia and evidence of urinary tract infection. She was given a dose of lorazepam with some subjective improvement. She is discharged with prescriptions for lorazepam and nitrofurantoin. Follow-up with PCP in one week.  I personally performed the services described in this documentation, which was scribed in my presence. The recorded information has been reviewed and is accurate.        Dione Booze, MD 12/26/15 [phone]

## 2015-12-26 NOTE — ED Notes (Signed)
[facility] called  

## 2015-12-26 NOTE — ED Notes (Signed)
Pt leaving with [facility] 

## 2016-01-01 ENCOUNTER — Other Ambulatory Visit: Payer: Self-pay | Admitting: Nurse Practitioner

## 2016-01-01 DIAGNOSIS — Z1231 Encounter for screening mammogram for malignant neoplasm of breast: Secondary | ICD-10-CM

## 2016-01-15 ENCOUNTER — Ambulatory Visit: Payer: Medicare Other

## 2016-03-14 ENCOUNTER — Emergency Department (HOSPITAL_COMMUNITY)
Admission: EM | Admit: 2016-03-14 | Discharge: 2016-03-14 | Disposition: A | Discharge status: Home / Self Care | Payer: Medicare Other | Attending: Emergency Medicine | Admitting: Emergency Medicine

## 2016-03-14 ENCOUNTER — Encounter (HOSPITAL_COMMUNITY): Payer: Self-pay | Admitting: Emergency Medicine

## 2016-03-14 DIAGNOSIS — Z7984 Long term (current) use of oral hypoglycemic drugs: Secondary | ICD-10-CM | POA: Diagnosis not present

## 2016-03-14 DIAGNOSIS — J869 Pyothorax without fistula: Secondary | ICD-10-CM | POA: Insufficient documentation

## 2016-03-14 DIAGNOSIS — Z79899 Other long term (current) drug therapy: Secondary | ICD-10-CM | POA: Diagnosis not present

## 2016-03-14 DIAGNOSIS — I10 Essential (primary) hypertension: Secondary | ICD-10-CM | POA: Diagnosis not present

## 2016-03-14 DIAGNOSIS — L0291 Cutaneous abscess, unspecified: Secondary | ICD-10-CM

## 2016-03-14 DIAGNOSIS — E119 Type 2 diabetes mellitus without complications: Secondary | ICD-10-CM | POA: Insufficient documentation

## 2016-03-14 DIAGNOSIS — J45909 Unspecified asthma, uncomplicated: Secondary | ICD-10-CM | POA: Insufficient documentation

## 2016-03-14 DIAGNOSIS — Z7982 Long term (current) use of aspirin: Secondary | ICD-10-CM | POA: Insufficient documentation

## 2016-03-14 MED ORDER — CEPHALEXIN 500 MG PO CAPS: 500.0000 mg | ORAL_CAPSULE | Freq: Two times a day (BID) | ORAL | 0 refills | Status: DC

## 2016-03-14 NOTE — ED Notes (Signed)
Pt from [facility] with complaints of opened abscess x 2 days with serosanguinous drainage . Area is not warm to the touch, but is open.

## 2016-03-14 NOTE — ED Provider Notes (Signed)
[facility]-EMERGENCY DEPT Provider Note   CSN: [id number] Arrival date & time: 03/14/16  1701     History   Chief Complaint Chief Complaint  Patient presents with  . Abscess    HPI [name] is a 75 y.o. female.  HPI Patient presents with "boil" to the midthoracic back for the past few days. Patient states that the boil enlarged and then started draining. She states currently painful. She denies any fever or chills. No other rashes. Otherwise states she's in her normal health.  Past Medical History:  Diagnosis Date  . Anemia   . Arthritis   . Asthma   . Cervical stenosis of spine   . Diabetes mellitus   . Hypertension   . Poor historian   . Rectal bleeding   . Schizophrenia, schizo-affective (HCC)   . Shortness of breath on exertion   . Tachycardia     Patient Active Problem List   Diagnosis Date Noted  . Dislocation of left hip (HCC) 05/16/2015  . Hip dysplasia, acquired 05/16/2015  . Dislocation of hip (HCC)   . HTN (hypertension) 03/17/2012  . GERD (gastroesophageal reflux disease) 03/17/2012  . Dementia 03/17/2012  . Essential hypertension 03/14/2009  . Schizophrenia (HCC) 11/02/2008  . DIZZINESS 11/02/2008  . Headache(784.0) 11/02/2008    Past Surgical History:  Procedure Laterality Date  . CESAREAN SECTION     x4    OB History    No data available       Home Medications    Prior to Admission medications   Medication Sig Start Date End Date Taking? Authorizing Provider  acetaminophen (TYLENOL) 500 MG tablet Take 1,000 mg by mouth 2 (two) times daily. Patient takes for arthritis pain.    Historical Provider, MD  ALPRAZolam Prudy Feeler) 0.5 MG tablet Take 0.5 mg by mouth 3 (three) times daily as needed. For agitation/anxiety    Historical Provider, MD  aspirin 81 MG chewable tablet Chew 81 mg by mouth daily.    Historical Provider, MD  benztropine (COGENTIN) 1 MG tablet Take 1 mg by mouth 2 (two) times daily.    Historical Provider, MD  cephALEXin  (KEFLEX) 500 MG capsule Take 1 capsule (500 mg total) by mouth 2 (two) times daily. 03/14/16   Loren Racer, MD  diclofenac sodium (VOLTAREN) 1 % GEL Apply 2 g topically 2 (two) times daily. Applied to affected area on left knee    Historical Provider, MD  docusate sodium (COLACE) 100 MG capsule Take 100 mg by mouth 2 (two) times daily.    Historical Provider, MD  donepezil (ARICEPT) 10 MG tablet Take 10 mg by mouth at bedtime.    Historical Provider, MD  escitalopram (LEXAPRO) 10 MG tablet Take 10 mg by mouth daily.    Historical Provider, MD  ferrous sulfate 325 (65 FE) MG tablet Take 325 mg by mouth 2 (two) times daily. anemia    Historical Provider, MD  gabapentin (NEURONTIN) 100 MG capsule Take 100 mg by mouth daily. Give daily at 3pm. Do not give with Risperidone.    Historical Provider, MD  HYDROcodone-acetaminophen (NORCO/VICODIN) 5-325 MG tablet Take 1-2 tablets by mouth every 6 (six) hours as needed for moderate pain. 05/17/15   Esperanza Sheets, MD  lisinopril (PRINIVIL,ZESTRIL) 10 MG tablet Take 10 mg by mouth daily.    Historical Provider, MD  LORazepam (ATIVAN) 1 MG tablet Take 1 tablet (1 mg total) by mouth 3 (three) times daily as needed for anxiety. 12/26/15   Onalee Hua  Preston Fleeting, MD  metFORMIN (GLUCOPHAGE) 500 MG tablet Take 500 mg by mouth 2 (two) times daily with a meal.    Historical Provider, MD  metoprolol tartrate (LOPRESSOR) 25 MG tablet Take 25 mg by mouth 2 (two) times daily.    Historical Provider, MD  nitrofurantoin, macrocrystal-monohydrate, (MACROBID) 100 MG capsule Take 1 capsule (100 mg total) by mouth 2 (two) times daily. X 7 days 12/26/15   Dione Booze, MD  omeprazole (PRILOSEC) 20 MG capsule Take 20 mg by mouth daily.    Historical Provider, MD  polyethylene glycol (MIRALAX / GLYCOLAX) packet Take 17 g by mouth daily.    Historical Provider, MD  risperiDONE (RISPERDAL) 3 MG tablet Take 3 mg by mouth 2 (two) times daily.    Historical Provider, MD  topiramate (TOPAMAX) 50 MG  tablet Take 50 mg by mouth 2 (two) times daily.    Historical Provider, MD  vitamin C (ASCORBIC ACID) 500 MG tablet Take 500 mg by mouth 2 (two) times daily.    Historical Provider, MD    Family History No family history on file.  Social History Social History  Substance Use Topics  . Smoking status: Never Smoker  . Smokeless tobacco: Never Used  . Alcohol use No     Allergies   Review of patient's allergies indicates no known allergies.   Review of Systems Review of Systems  Constitutional: Negative for chills, fatigue and fever.  Respiratory: Negative for cough and shortness of breath.   Cardiovascular: Negative for chest pain.  Gastrointestinal: Negative for abdominal pain and vomiting.  Genitourinary: Negative for dysuria and flank pain.  Musculoskeletal: Negative for myalgias, neck pain and neck stiffness.  Skin: Positive for wound.  Neurological: Negative for dizziness, facial asymmetry, weakness, light-headedness, numbness and headaches.  All other systems reviewed and are negative.    Physical Exam Updated Vital Signs BP 147/67 (BP Location: Left Arm)   Pulse 78   Temp 98.4 F (36.9 C) (Oral)   SpO2 99%   Physical Exam  Constitutional: She is oriented to person, place, and time. She appears well-developed and well-nourished. No distress.  HENT:  Head: Normocephalic and atraumatic.  Mouth/Throat: Oropharynx is clear and moist.  Eyes: EOM are normal. Pupils are equal, round, and reactive to light.  Neck: Normal range of motion. Neck supple.  Cardiovascular: Normal rate and regular rhythm.   Pulmonary/Chest: Effort normal and breath sounds normal.  Abdominal: Soft. Bowel sounds are normal. There is no tenderness. There is no rebound and no guarding.  Musculoskeletal: Normal range of motion. She exhibits no edema or tenderness.  Neurological: She is alert and oriented to person, place, and time.  Moving all extremity is without deficit. Sensation is fully  intact. Ambulating without difficulty.  Skin: Skin is warm and dry. Rash noted. No erythema.  Patient has 2 small 1 cm ulcerative lesions to the right paraspinal muscle of the thoracic back area roughly at the level of T6. Mild surrounding erythema. No induration or fluctuant masses.  Psychiatric: She has a normal mood and affect. Her behavior is normal.  Nursing note and vitals reviewed.    ED Treatments / Results  Labs (all labs ordered are listed, but only abnormal results are displayed) Labs Reviewed - No data to display  EKG  EKG Interpretation None       Radiology No results found.  Procedures Procedures (including critical care time)  Medications Ordered in ED Medications - No data to display   Initial Impression /  Assessment and Plan / ED Course  I have reviewed the triage vital signs and the nursing notes.  Pertinent labs & imaging results that were available during my care of the patient were reviewed by me and considered in my medical decision making (see chart for details).  Clinical Course   The patient is well-appearing. Question spontaneous draining abscess. Given mild erythema and history of diabetes will start on antibiotics area and advised follow-up with her primary physician. Return precautions given.  Final Clinical Impressions(s) / ED Diagnoses   Final diagnoses:  Abscess    New Prescriptions New Prescriptions   CEPHALEXIN (KEFLEX) 500 MG CAPSULE    Take 1 capsule (500 mg total) by mouth 2 (two) times daily.     Loren Raceravid Rawad Bochicchio, MD 03/14/16 Windell [id number]

## 2016-03-14 NOTE — ED Notes (Signed)
[facility] is in route 

## 2016-03-14 NOTE — ED Triage Notes (Addendum)
Per EMS pt from [facility] with complaint of boil to upper mid back for 2 days.

## 2016-03-27 ENCOUNTER — Inpatient Hospital Stay: Admission: RE | Admit: 2016-03-27 | Payer: Medicare Other | Source: Ambulatory Visit

## 2016-09-24 IMAGING — CT CT HIP*L* W/O CM
2 of 7 series · 13 of 46 positions shown, 19 images · non-contrast
Comparison: Radiographs 05/16/2015 and earlier

CLINICAL DATA: Left hip pain.  No trauma.

EXAM:
CT OF THE LEFT HIP WITHOUT CONTRAST
TECHNIQUE: Multidetector CT imaging of the left hip was performed according to
the standard protocol. Multiplanar CT image reconstructions were
also generated.

[Series 8: coronal st · coronal · 0.32mm/px · 3 of 79 slices shown]
[im 20/79  soft-tissue]
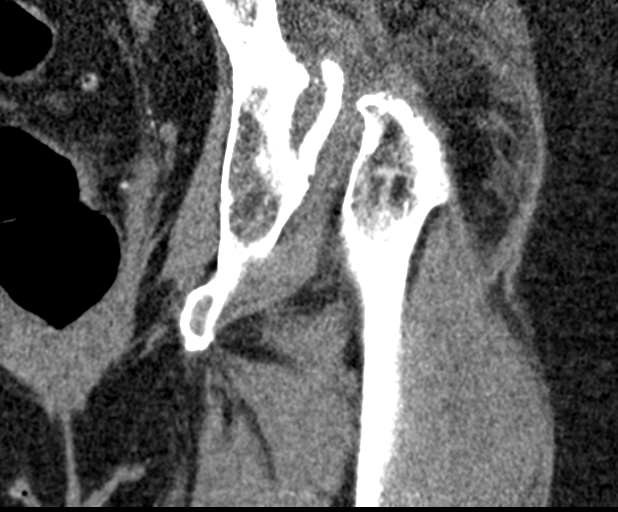
[im 40/79  soft-tissue]
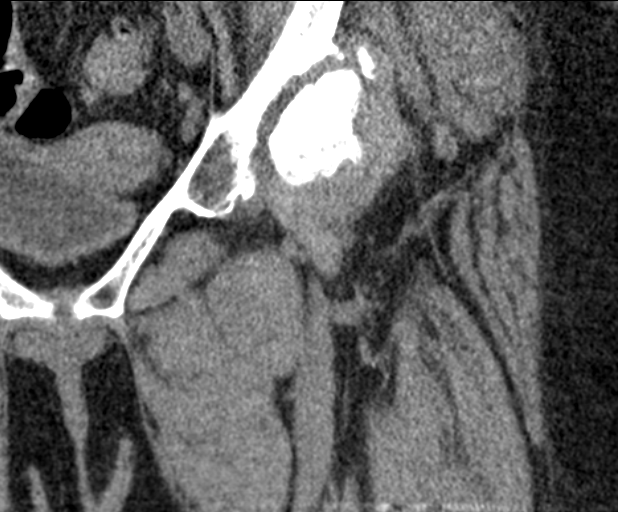
[im 59/79  soft-tissue]
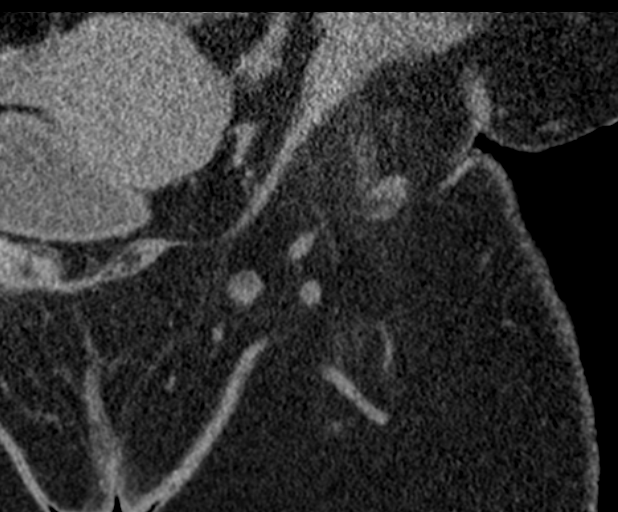

[Series 10: axial st · axial · 0.36mm/px · z∈[-645,-510]mm · 10 of 82 slices shown, 16 images]
[im 7/82  soft-tissue]
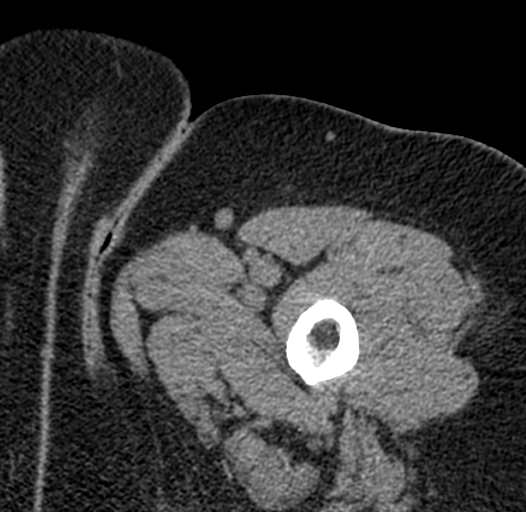
[im 7/82  bone]
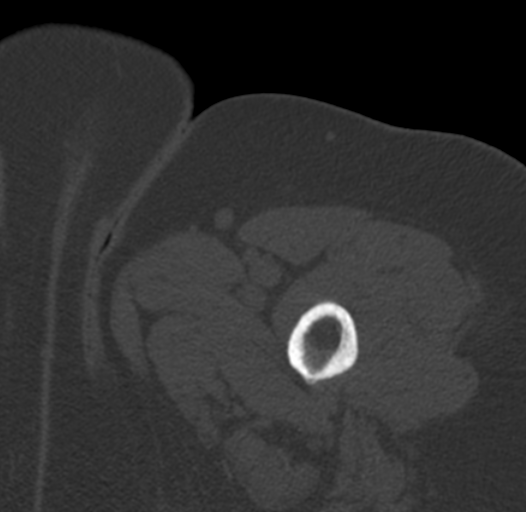
[im 14/82  soft-tissue]
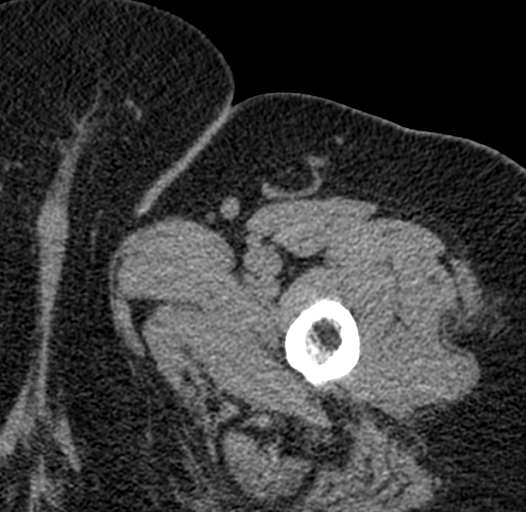
[im 21/82  soft-tissue]
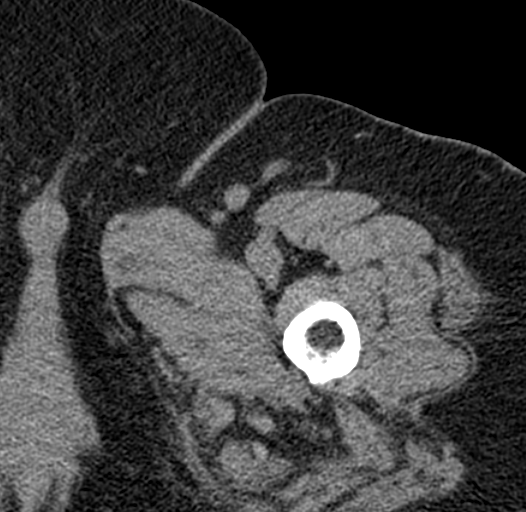
[im 28/82  soft-tissue]
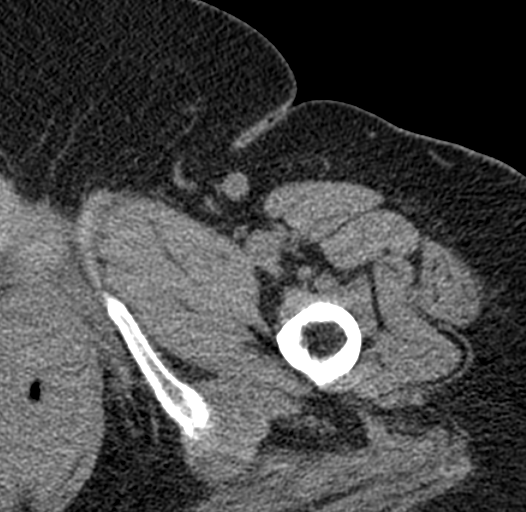
[im 34/82  soft-tissue]
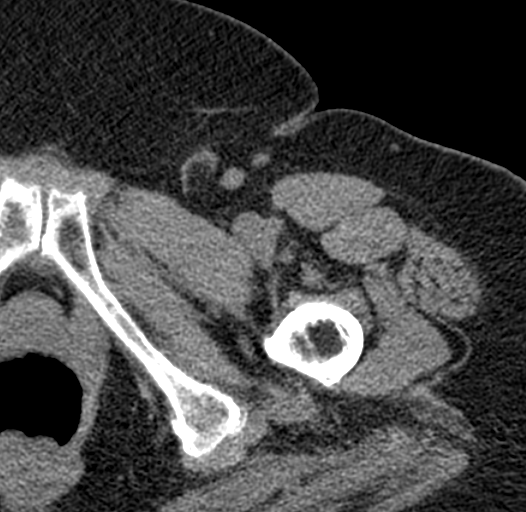
[im 48/82  soft-tissue]
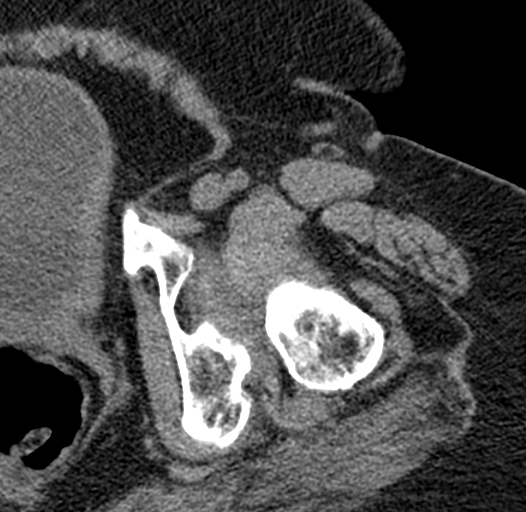
[im 55/82  soft-tissue]
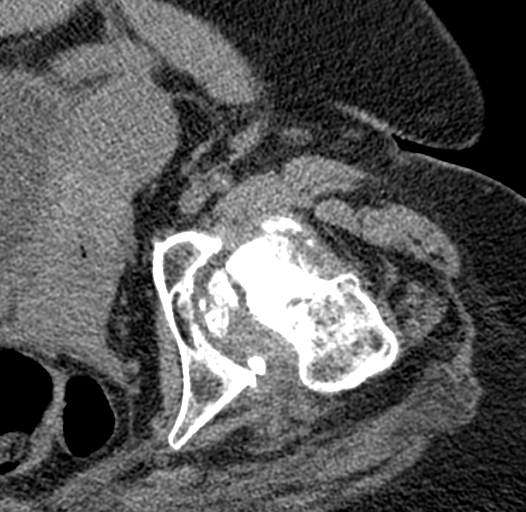
[im 55/82  lung]
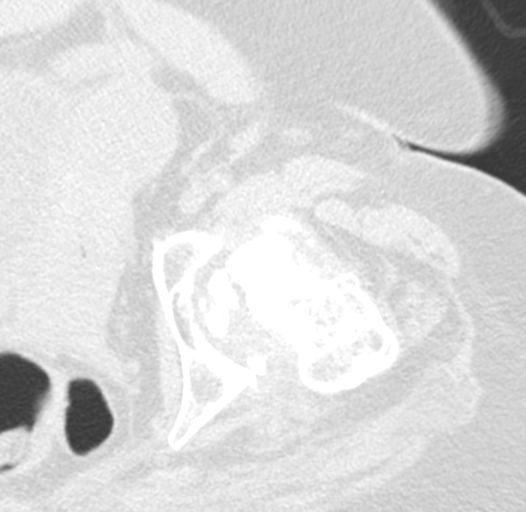
[im 61/82  soft-tissue]
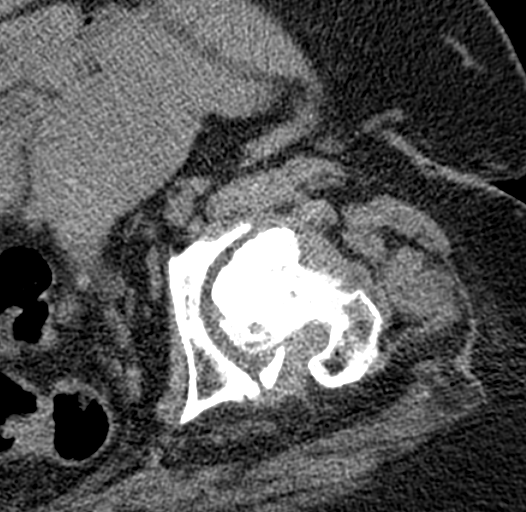
[im 61/82  lung]
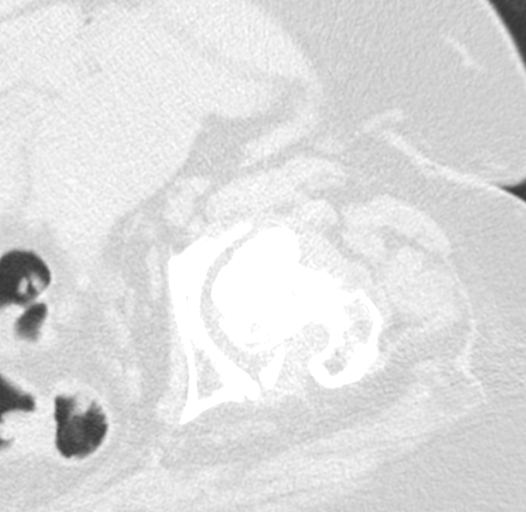
[im 68/82  soft-tissue]
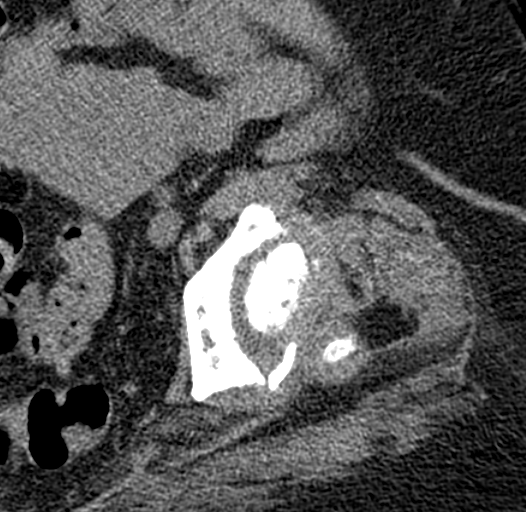
[im 68/82  lung]
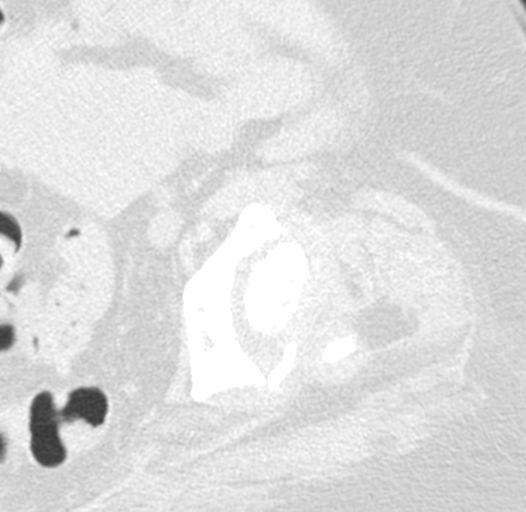
[im 68/82  bone]
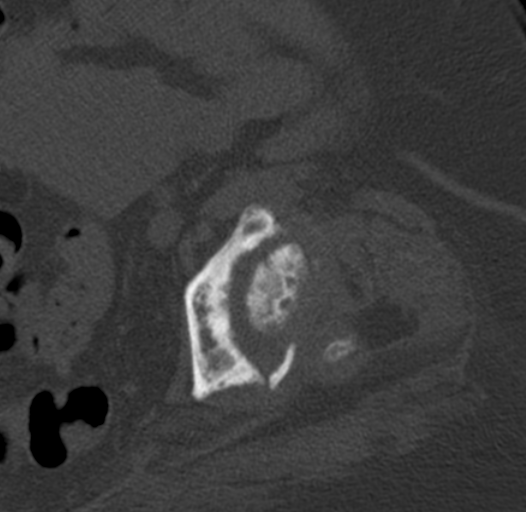
[im 75/82  soft-tissue]
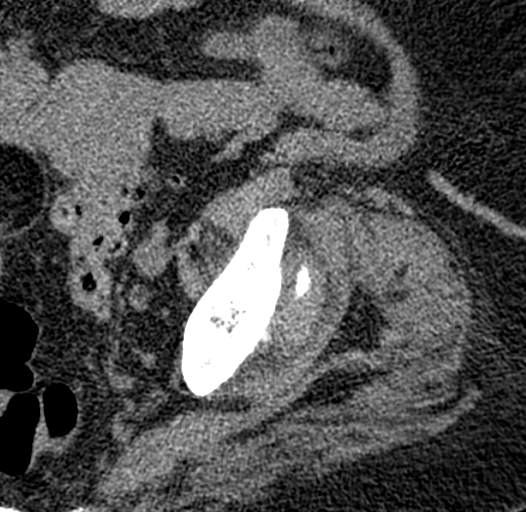
[im 75/82  lung]
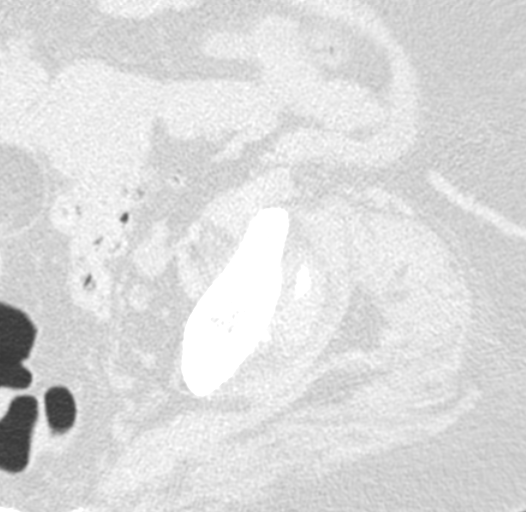

[13 of 46 positions shown; findings below may reference images not displayed]

FINDINGS: There are severe chronic arthropathic changes of the left hip. There
is chronic fragmentation of the femoral head with marked sclerosis.
There is flattening, sclerosis and subchondral degenerative cyst
formation of the acetabulum. There is long-standing superior
subluxation of the hip, with pseudoarticulation in the
supra-acetabular region associated with the chronic flattening,
chronic sclerosis and fragmentation. No conclusive acute fracture is
present although the numerous old fragments will reduce sensitivity.
No bone lesion or mass is evident. No acute soft tissue abnormality
is evident.
IMPRESSION: Severe chronic arthropathy of the left hip with superior subluxation
and pseudoarticulation. Marked flattening of the femoral head and
chronic sclerosis/fragmentation. No conclusive acute findings.

## 2017-08-04 ENCOUNTER — Other Ambulatory Visit: Payer: Self-pay | Admitting: Orthopedic Surgery

## 2017-08-04 DIAGNOSIS — M1612 Unilateral primary osteoarthritis, left hip: Secondary | ICD-10-CM

## 2017-12-08 ENCOUNTER — Other Ambulatory Visit: Payer: Self-pay

## 2017-12-08 ENCOUNTER — Emergency Department (HOSPITAL_COMMUNITY)
Admission: EM | Admit: 2017-12-08 | Discharge: 2017-12-08 | Disposition: A | Discharge status: Home / Self Care | Payer: Medicare Other | Attending: Emergency Medicine | Admitting: Emergency Medicine

## 2017-12-08 ENCOUNTER — Encounter (HOSPITAL_COMMUNITY): Payer: Self-pay | Admitting: Emergency Medicine

## 2017-12-08 ENCOUNTER — Telehealth: Payer: Self-pay | Admitting: *Deleted

## 2017-12-08 ENCOUNTER — Emergency Department (HOSPITAL_COMMUNITY): Payer: Medicare Other

## 2017-12-08 DIAGNOSIS — I10 Essential (primary) hypertension: Secondary | ICD-10-CM | POA: Diagnosis not present

## 2017-12-08 DIAGNOSIS — J45909 Unspecified asthma, uncomplicated: Secondary | ICD-10-CM | POA: Diagnosis not present

## 2017-12-08 DIAGNOSIS — R079 Chest pain, unspecified: Secondary | ICD-10-CM | POA: Diagnosis present

## 2017-12-08 DIAGNOSIS — F0390 Unspecified dementia without behavioral disturbance: Secondary | ICD-10-CM | POA: Insufficient documentation

## 2017-12-08 DIAGNOSIS — Z7982 Long term (current) use of aspirin: Secondary | ICD-10-CM | POA: Diagnosis not present

## 2017-12-08 DIAGNOSIS — E119 Type 2 diabetes mellitus without complications: Secondary | ICD-10-CM | POA: Insufficient documentation

## 2017-12-08 DIAGNOSIS — Z79899 Other long term (current) drug therapy: Secondary | ICD-10-CM | POA: Diagnosis not present

## 2017-12-08 DIAGNOSIS — R0789 Other chest pain: Secondary | ICD-10-CM | POA: Diagnosis not present

## 2017-12-08 DIAGNOSIS — Z7984 Long term (current) use of oral hypoglycemic drugs: Secondary | ICD-10-CM | POA: Insufficient documentation

## 2017-12-08 LAB — CBC
HEMATOCRIT: 31.8 % — AB (ref 36.0–46.0)
HEMOGLOBIN: 9.9 g/dL — AB (ref 12.0–15.0)
MCH: 21.7 pg — AB (ref 26.0–34.0)
MCHC: 31.1 g/dL (ref 30.0–36.0)
MCV: 69.7 fL — ABNORMAL LOW (ref 78.0–100.0)
Platelets: 246 10*3/uL (ref 150–400)
RBC: 4.56 MIL/uL (ref 3.87–5.11)
RDW: 15.2 % (ref 11.5–15.5)
WBC: 6.7 10*3/uL (ref 4.0–10.5)

## 2017-12-08 LAB — BASIC METABOLIC PANEL
ANION GAP: 11 (ref 5–15)
BUN: 11 mg/dL (ref 6–20)
CO2: 21 mmol/L — AB (ref 22–32)
Calcium: 8.9 mg/dL (ref 8.9–10.3)
Chloride: 97 mmol/L — ABNORMAL LOW (ref 101–111)
Creatinine, Ser: 0.93 mg/dL (ref 0.44–1.00)
GFR calc non Af Amer: 59 mL/min — ABNORMAL LOW (ref >60)
GLUCOSE: 115 mg/dL — AB (ref 65–99)
Potassium: 4.2 mmol/L (ref 3.5–5.1)
Sodium: 129 mmol/L — ABNORMAL LOW (ref 135–145)

## 2017-12-08 LAB — I-STAT TROPONIN, ED
TROPONIN I, POC: 0 ng/mL (ref 0.00–0.08)
Troponin i, poc: 0.01 ng/mL (ref 0.00–0.08)

## 2017-12-08 MED ORDER — NAPROXEN 375 MG PO TABS: 375.0000 mg | ORAL_TABLET | Freq: Two times a day (BID) | ORAL | 0 refills | Status: DC

## 2017-12-08 MED ORDER — KETOROLAC TROMETHAMINE 60 MG/2ML IM SOLN
30.0000 mg | Freq: Once | INTRAMUSCULAR | Status: AC
  Administered 2017-12-08: 30 mg via INTRAMUSCULAR
  Filled 2017-12-08: qty 2

## 2017-12-08 MED ORDER — ACETAMINOPHEN 500 MG PO TABS
1000.0000 mg | ORAL_TABLET | Freq: Once | ORAL | Status: AC
  Administered 2017-12-08: 1000 mg via ORAL
  Filled 2017-12-08: qty 2

## 2017-12-08 NOTE — ED Notes (Signed)
Patient verbalizes understanding of discharge instructions. Opportunity for questioning and answers were provided. Armband removed by staff, pt discharged from ED. E signature not available. Pt leaving to [facility] via [facility].

## 2017-12-08 NOTE — ED Triage Notes (Signed)
Pt BIB [facility] from [facility] with c/o cough x 3 days, chest pain with cough and palpation. Given  aspirin and 2 NTG PTA. VSS.

## 2017-12-08 NOTE — Telephone Encounter (Signed)
[name], RCD called related to Rx: naproxen as pt is also on ibuprofen .Marland KitchenMarland KitchenEDCM clarified with EDP Idelle Leech) to d/c ibuprofen and take naproxen as written.

## 2017-12-08 NOTE — ED Notes (Signed)
[facility] called for pt 

## 2017-12-08 NOTE — ED Notes (Signed)
ED Provider at bedside. 

## 2017-12-08 NOTE — ED Provider Notes (Signed)
[facility] EMERGENCY DEPARTMENT Provider Note   CSN: [id number] Arrival date & time: 12/08/17  4098     History   Chief Complaint Chief Complaint  Patient presents with  . Chest Pain    HPI [name] is a 77 y.o. female.  The history is provided by the patient.  Chest Pain   This is a new problem. The current episode started more than 2 days ago. The problem occurs constantly. The problem has not changed since onset.The pain is associated with movement and raising an arm. The pain is present in the substernal region. The pain is at a severity of 4/10. The pain is moderate. The quality of the pain is described as dull. The pain does not radiate. The symptoms are aggravated by certain positions. Pertinent negatives include no abdominal pain, no diaphoresis, no fever, no hemoptysis, no leg pain, no lower extremity edema, no nausea, no palpitations, no shortness of breath, no sputum production, no syncope, no vomiting and no weakness. Treatments tried: ibuprofen. The treatment provided significant relief. Risk factors include being elderly.  Pertinent negatives for past medical history include no aneurysm and no MI.  Pertinent negatives for family medical history include: no Marfan's syndrome.  Procedure history is negative for EPS study.    Past Medical History:  Diagnosis Date  . Anemia   . Arthritis   . Asthma   . Cervical stenosis of spine   . Diabetes mellitus   . Hypertension   . Poor historian   . Rectal bleeding   . Schizophrenia, schizo-affective (HCC)   . Shortness of breath on exertion   . Tachycardia     Patient Active Problem List   Diagnosis Date Noted  . Dislocation of left hip (HCC) 05/16/2015  . Hip dysplasia, acquired 05/16/2015  . Dislocation of hip (HCC)   . HTN (hypertension) 03/17/2012  . GERD (gastroesophageal reflux disease) 03/17/2012  . Dementia 03/17/2012  . Essential hypertension 03/14/2009  . Schizophrenia (HCC)  11/02/2008  . DIZZINESS 11/02/2008  . Headache(784.0) 11/02/2008    Past Surgical History:  Procedure Laterality Date  . CESAREAN SECTION     x4     OB History   None      Home Medications    Prior to Admission medications   Medication Sig Start Date End Date Taking? Authorizing Provider  acetaminophen (TYLENOL) 500 MG tablet Take 1,000 mg by mouth 2 (two) times daily. Patient takes for arthritis pain.    [provider]  ALPRAZolam Prudy Feeler) 0.5 MG tablet Take 0.5 mg by mouth 3 (three) times daily as needed. For agitation/anxiety    [provider]  aspirin 81 MG chewable tablet Chew 81 mg by mouth daily.    [provider]  benztropine (COGENTIN) 1 MG tablet Take 1 mg by mouth 2 (two) times daily.    [provider]  cephALEXin (KEFLEX) 500 MG capsule Take 1 capsule (500 mg total) by mouth 2 (two) times daily. 03/14/16   Loren Racer, MD  diclofenac sodium (VOLTAREN) 1 % GEL Apply 2 g topically 2 (two) times daily. Applied to affected area on left knee    [provider]  docusate sodium (COLACE) 100 MG capsule Take 100 mg by mouth 2 (two) times daily.    [provider]  donepezil (ARICEPT) 10 MG tablet Take 10 mg by mouth at bedtime.    [provider]  escitalopram (LEXAPRO) 10 MG tablet Take 10 mg by mouth  daily.    [provider]  ferrous sulfate 325 (65 FE) MG tablet Take 325 mg by mouth 2 (two) times daily. anemia    [provider]  gabapentin (NEURONTIN) 100 MG capsule Take 100 mg by mouth daily. Give daily at 3pm. Do not give with Risperidone.    [provider]  HYDROcodone-acetaminophen (NORCO/VICODIN) 5-325 MG tablet Take 1-2 tablets by mouth every 6 (six) hours as needed for moderate pain. 05/17/15   Esperanza Sheets, MD  lisinopril (PRINIVIL,ZESTRIL) 10 MG tablet Take 10 mg by mouth daily.    [provider]  LORazepam (ATIVAN) 1 MG tablet Take 1 tablet (1 mg total)  by mouth 3 (three) times daily as needed for anxiety. 12/26/15   Dione Booze, MD  metFORMIN (GLUCOPHAGE) 500 MG tablet Take 500 mg by mouth 2 (two) times daily with a meal.    [provider]  metoprolol tartrate (LOPRESSOR) 25 MG tablet Take 25 mg by mouth 2 (two) times daily.    [provider]  nitrofurantoin, macrocrystal-monohydrate, (MACROBID) 100 MG capsule Take 1 capsule (100 mg total) by mouth 2 (two) times daily. X 7 days 12/26/15   Dione Booze, MD  omeprazole (PRILOSEC) 20 MG capsule Take 20 mg by mouth daily.    [provider]  polyethylene glycol (MIRALAX / GLYCOLAX) packet Take 17 g by mouth daily.    [provider]  risperiDONE (RISPERDAL) 3 MG tablet Take 3 mg by mouth 2 (two) times daily.    [provider]  topiramate (TOPAMAX) 50 MG tablet Take 50 mg by mouth 2 (two) times daily.    [provider]  vitamin C (ASCORBIC ACID) 500 MG tablet Take 500 mg by mouth 2 (two) times daily.    [provider]    Family History No family history on file.  Social History Social History   Tobacco Use  . Smoking status: Never Smoker  . Smokeless tobacco: Never Used  Substance Use Topics  . Alcohol use: No  . Drug use: No     Allergies   Patient has no known allergies.   Review of Systems Review of Systems  Constitutional: Negative for diaphoresis and fever.  Respiratory: Negative for hemoptysis, sputum production, chest tightness and shortness of breath.   Cardiovascular: Positive for chest pain. Negative for palpitations, leg swelling and syncope.  Gastrointestinal: Negative for abdominal pain, nausea and vomiting.  Neurological: Negative for weakness.  All other systems reviewed and are negative.    Physical Exam Updated Vital Signs BP 108/69 (BP Location: Right Arm)   Pulse 88   Temp 98.4 F (36.9 C) (Oral)   Resp 17   SpO2 99%   Physical Exam  Constitutional: She appears well-developed and  well-nourished. No distress.  HENT:  Head: Normocephalic and atraumatic.  Mouth/Throat: No oropharyngeal exudate.  Eyes: Pupils are equal, round, and reactive to light. Conjunctivae are normal.  Neck: Normal range of motion. Neck supple.  Cardiovascular: Normal rate, regular rhythm, normal heart sounds and intact distal pulses.  Pulmonary/Chest: Effort normal and breath sounds normal. No stridor. No respiratory distress. She has no wheezes. She has no rales. She exhibits tenderness.  Abdominal: Soft. Bowel sounds are normal. She exhibits no mass. There is no tenderness. There is no rebound and no guarding.  Musculoskeletal: Normal range of motion. She exhibits no edema or tenderness.  Neurological: She is alert. She displays normal reflexes.  Skin: Capillary refill takes less than 2 seconds.  Psychiatric: She has a normal mood and affect.     ED Treatments / Results  Labs (all labs ordered are listed, but only abnormal results are displayed) Results for orders placed or performed during the hospital encounter of 12/08/17  Basic metabolic panel  Result Value Ref Range   Sodium 129 (L) 135 - 145 mmol/L   Potassium 4.2 3.5 - 5.1 mmol/L   Chloride 97 (L) 101 - 111 mmol/L   CO2 21 (L) 22 - 32 mmol/L   Glucose, Bld 115 (H) 65 - 99 mg/dL   BUN 11 6 - 20 mg/dL   Creatinine, Ser 1.61 0.44 - 1.00 mg/dL   Calcium 8.9 8.9 - 09.6 mg/dL   GFR calc non Af Amer 59 (L) >60 mL/min   GFR calc Af Amer >60 >60 mL/min   Anion gap 11 5 - 15  CBC  Result Value Ref Range   WBC 6.7 4.0 - 10.5 K/uL   RBC 4.56 3.87 - 5.11 MIL/uL   Hemoglobin 9.9 (L) 12.0 - 15.0 g/dL   HCT 04.5 (L) 40.9 - 81.1 %   MCV 69.7 (L) 78.0 - 100.0 fL   MCH 21.7 (L) 26.0 - 34.0 pg   MCHC 31.1 30.0 - 36.0 g/dL   RDW 91.4 78.2 - 95.6 %   Platelets 246 150 - 400 K/uL  I-stat troponin, ED  Result Value Ref Range   Troponin i, poc 0.00 0.00 - 0.08 ng/mL   Comment 3           Dg Chest 2 View  Result Date: 12/08/2017 CLINICAL  DATA:  Chest pain, cough EXAM: CHEST - 2 VIEW COMPARISON:  05/16/2015 FINDINGS: Lungs are clear. Mild elevation of the right hemidiaphragm. No pleural effusion or pneumothorax. The heart is normal in size. Mild degenerative changes of the visualized thoracolumbar spine. IMPRESSION: Normal chest radiographs. Electronically Signed   By: Charline Bills M.D.   On: 12/08/2017 01:28    EKG EKG Interpretation  Date/Time:  Wednesday Dec 08 2017 01:01:09 EDT Ventricular Rate:  86 PR Interval:  232 QRS Duration: 74 QT Interval:  394 QTC Calculation: 471 R Axis:   41 Text Interpretation:  Sinus rhythm with 1st degree A-V block Confirmed by Nicanor Alcon, Mixtli Reno ([id number]) on 12/08/2017 1:07:12 AM   Radiology Dg Chest 2 View  Result Date: 12/08/2017 CLINICAL DATA:  Chest pain, cough EXAM: CHEST - 2 VIEW COMPARISON:  05/16/2015 FINDINGS: Lungs are clear. Mild elevation of the right hemidiaphragm. No pleural effusion or pneumothorax. The heart is normal in size. Mild degenerative changes of the visualized thoracolumbar spine. IMPRESSION: Normal chest radiographs. Electronically Signed   By: Charline Bills M.D.   On: 12/08/2017 01:28    Procedures Procedures (including critical care time)  Medications Ordered in ED Medications  ketorolac (TORADOL) injection 30 mg (30 mg Intramuscular Given 12/08/17 0223)  acetaminophen (TYLENOL) tablet 1,000 mg (1,000 mg Oral Given 12/08/17 0223)     Rule out for MI in the ED, symptoms consistent with MSK pain.  Reproducible  Final Clinical Impressions(s) / ED Diagnoses   Return for weakness, numbness, changes in vision or speech, fevers >100.4 unrelieved by medication, shortness of breath, intractable vomiting, or diarrhea, abdominal pain, Inability to tolerate liquids or food, cough, altered mental status or any concerns. No signs of systemic illness or infection. The patient is nontoxic-appearing on exam and vital signs are within normal limits.   I have  reviewed the triage vital signs and the nursing notes. Pertinent  labs &imaging results that were available during my care of the patient were reviewed by me and considered in my medical decision making (see chart for details).  After history, exam, and medical workup I feel the patient has been appropriately medically screened and is safe for discharge home. Pertinent diagnoses were discussed with the patient. Patient was given return precautions.     Kenard Morawski, MD 12/08/17 [phone]

## 2017-12-17 ENCOUNTER — Telehealth: Payer: Self-pay | Admitting: *Deleted

## 2017-12-17 NOTE — Telephone Encounter (Signed)
Pharmacy called related to Rx: naproxen having drug interaction with leaxpro (increase chance for GI bleed) .Marland Kitchen.Marland Kitchen.EDCM clarified with EDP (Campos) to change Rx to: tylenol 650 mg BID #20.

## 2018-07-14 ENCOUNTER — Emergency Department (HOSPITAL_COMMUNITY)
Admission: EM | Admit: 2018-07-14 | Discharge: 2018-07-15 | Disposition: A | Discharge status: Skilled Nursing Facility | Payer: Medicare Other | Attending: Emergency Medicine | Admitting: Emergency Medicine

## 2018-07-14 ENCOUNTER — Encounter (HOSPITAL_COMMUNITY): Payer: Self-pay | Admitting: *Deleted

## 2018-07-14 ENCOUNTER — Other Ambulatory Visit: Payer: Self-pay

## 2018-07-14 DIAGNOSIS — Z79899 Other long term (current) drug therapy: Secondary | ICD-10-CM | POA: Diagnosis not present

## 2018-07-14 DIAGNOSIS — F0390 Unspecified dementia without behavioral disturbance: Secondary | ICD-10-CM | POA: Diagnosis not present

## 2018-07-14 DIAGNOSIS — I10 Essential (primary) hypertension: Secondary | ICD-10-CM | POA: Insufficient documentation

## 2018-07-14 DIAGNOSIS — B9789 Other viral agents as the cause of diseases classified elsewhere: Secondary | ICD-10-CM | POA: Insufficient documentation

## 2018-07-14 DIAGNOSIS — Z7982 Long term (current) use of aspirin: Secondary | ICD-10-CM | POA: Diagnosis not present

## 2018-07-14 DIAGNOSIS — E119 Type 2 diabetes mellitus without complications: Secondary | ICD-10-CM | POA: Insufficient documentation

## 2018-07-14 DIAGNOSIS — R05 Cough: Secondary | ICD-10-CM | POA: Diagnosis present

## 2018-07-14 DIAGNOSIS — Z7984 Long term (current) use of oral hypoglycemic drugs: Secondary | ICD-10-CM | POA: Diagnosis not present

## 2018-07-14 DIAGNOSIS — J45909 Unspecified asthma, uncomplicated: Secondary | ICD-10-CM | POA: Diagnosis not present

## 2018-07-14 DIAGNOSIS — J069 Acute upper respiratory infection, unspecified: Secondary | ICD-10-CM | POA: Insufficient documentation

## 2018-07-14 DIAGNOSIS — Z209 Contact with and (suspected) exposure to unspecified communicable disease: Secondary | ICD-10-CM | POA: Insufficient documentation

## 2018-07-14 NOTE — ED Notes (Signed)
Bed: WA01 Expected date:  Expected time:  Means of arrival:  Comments: [place]

## 2018-07-14 NOTE — ED Triage Notes (Signed)
Pt c/o cough x 2 days.  Pt from [facility].  LS clear, non-productive cough.

## 2018-07-14 NOTE — ED Notes (Signed)
Bed: WLPT1 Expected date:  Expected time:  Means of arrival:  Comments: 

## 2018-07-15 ENCOUNTER — Emergency Department (HOSPITAL_COMMUNITY): Payer: Medicare Other

## 2018-07-15 DIAGNOSIS — J069 Acute upper respiratory infection, unspecified: Secondary | ICD-10-CM | POA: Diagnosis not present

## 2018-07-15 LAB — I-STAT CHEM 8, ED
BUN: 11 mg/dL (ref 8–23)
Calcium, Ion: 1.14 mmol/L — ABNORMAL LOW (ref 1.15–1.40)
Chloride: 106 mmol/L (ref 98–111)
Creatinine, Ser: 0.8 mg/dL (ref 0.44–1.00)
Glucose, Bld: 108 mg/dL — ABNORMAL HIGH (ref 70–99)
HEMATOCRIT: 33 % — AB (ref 36.0–46.0)
Hemoglobin: 11.2 g/dL — ABNORMAL LOW (ref 12.0–15.0)
POTASSIUM: 4.3 mmol/L (ref 3.5–5.1)
Sodium: 138 mmol/L (ref 135–145)
TCO2: 24 mmol/L (ref 22–32)

## 2018-07-15 MED ORDER — PREDNISONE 20 MG PO TABS: 40.0000 mg | ORAL_TABLET | Freq: Every day | ORAL | 0 refills | Status: AC

## 2018-07-15 MED ORDER — CEFTRIAXONE SODIUM 2 G IJ SOLR
2.0000 g | Freq: Once | INTRAMUSCULAR | Status: AC
  Administered 2018-07-15: 2 g via INTRAVENOUS
  Filled 2018-07-15: qty 20

## 2018-07-15 MED ORDER — DOXYCYCLINE HYCLATE 100 MG PO CAPS: 100.0000 mg | ORAL_CAPSULE | Freq: Two times a day (BID) | ORAL | 0 refills | Status: AC

## 2018-07-15 MED ORDER — AZITHROMYCIN 500 MG IV SOLR
500.0000 mg | Freq: Once | INTRAVENOUS | Status: AC
  Administered 2018-07-15: 500 mg via INTRAVENOUS
  Filled 2018-07-15: qty 500

## 2018-07-15 MED ORDER — ALBUTEROL SULFATE HFA 108 (90 BASE) MCG/ACT IN AERS
2.0000 | INHALATION_SPRAY | Freq: Once | RESPIRATORY_TRACT | Status: DC
  Filled 2018-07-15: qty 6.7

## 2018-07-15 MED ORDER — SODIUM CHLORIDE 0.9 % IV BOLUS
1000.0000 mL | Freq: Once | INTRAVENOUS | Status: AC
  Administered 2018-07-15: 1000 mL via INTRAVENOUS

## 2018-07-15 MED ORDER — PREDNISONE 20 MG PO TABS
60.0000 mg | ORAL_TABLET | Freq: Once | ORAL | Status: AC
  Administered 2018-07-15: 60 mg via ORAL
  Filled 2018-07-15: qty 3

## 2018-07-15 MED ORDER — IPRATROPIUM-ALBUTEROL 0.5-2.5 (3) MG/3ML IN SOLN
3.0000 mL | Freq: Once | RESPIRATORY_TRACT | Status: AC
  Administered 2018-07-15: 3 mL via RESPIRATORY_TRACT
  Filled 2018-07-15: qty 3

## 2018-07-15 MED ORDER — AMOXICILLIN-POT CLAVULANATE 875-125 MG PO TABS: 1.0000 | ORAL_TABLET | Freq: Two times a day (BID) | ORAL | 0 refills | Status: AC

## 2018-07-15 NOTE — ED Provider Notes (Signed)
[facility]-EMERGENCY DEPT Provider Note   CSN: [id number] Arrival date & time: 07/14/18  2307     History   Chief Complaint Chief Complaint  Patient presents with  . Cough    HPI [name] is a 78 y.o. female.  HPI   78 year old female with past medical history of schizophrenia here with cough.  Patient states that over the last several days, she has had progressively worsening dry cough.  She has had some mild shortness of breath.  No chest pain.  She does state that she has had some wheezing as well.  No sputum production.  No fevers or chills.  She has known sick contacts where she lives.  Denies any body aches.  No history of COPD or recurrent asthma.  She does have a reported history of recurrent bronchitis.  No other complaints.  No nausea or vomiting.  Her appetite is been normal.  No specific alleviating or aggravating factors.  She is tried over-the-counter cough medicine without significant relief.  Past Medical History:  Diagnosis Date  . Anemia   . Arthritis   . Asthma   . Cervical stenosis of spine   . Diabetes mellitus   . Hypertension   . Poor historian   . Rectal bleeding   . Schizophrenia, schizo-affective (HCC)   . Shortness of breath on exertion   . Tachycardia     Patient Active Problem List   Diagnosis Date Noted  . Dislocation of left hip (HCC) 05/16/2015  . Hip dysplasia, acquired 05/16/2015  . Dislocation of hip (HCC)   . HTN (hypertension) 03/17/2012  . GERD (gastroesophageal reflux disease) 03/17/2012  . Dementia (HCC) 03/17/2012  . Essential hypertension 03/14/2009  . Schizophrenia (HCC) 11/02/2008  . DIZZINESS 11/02/2008  . Headache(784.0) 11/02/2008    Past Surgical History:  Procedure Laterality Date  . CESAREAN SECTION     x4     OB History   No obstetric history on file.      Home Medications    Prior to Admission medications   Medication Sig Start Date End Date Taking? Authorizing Provider    acetaminophen (TYLENOL) 500 MG tablet Take 1,000 mg by mouth 2 (two) times daily. Patient takes for arthritis pain.   Yes [provider]  ALPRAZolam Prudy Feeler) 0.5 MG tablet Take 0.5 mg by mouth daily as needed for anxiety. For agitation/anxiety   Yes [provider]  aspirin 81 MG chewable tablet Chew 81 mg by mouth daily.   Yes [provider]  atorvastatin (LIPITOR) 10 MG tablet Take 5 mg by mouth daily.   Yes [provider]  benztropine (COGENTIN) 1 MG tablet Take 1 mg by mouth 2 (two) times daily.   Yes [provider]  donepezil (ARICEPT) 10 MG tablet Take 10 mg by mouth at bedtime.   Yes [provider]  escitalopram (LEXAPRO) 10 MG tablet Take 10 mg by mouth daily.   Yes [provider]  ferrous sulfate 325 (65 FE) MG tablet Take 325 mg by mouth 2 (two) times daily. anemia   Yes [provider]  guaifenesin (ROBITUSSIN) 100 MG/5ML syrup Take 300 mg by mouth every 6 (six) hours as needed for cough.   Yes [provider]  lisinopril (PRINIVIL,ZESTRIL) 5 MG tablet Take 5 mg by mouth daily.   Yes [provider]  metFORMIN (GLUCOPHAGE) 500 MG tablet Take 500 mg by mouth 2 (two) times daily with a meal.   Yes [provider]  metoprolol tartrate (LOPRESSOR) 25 MG tablet Take 25 mg by mouth 2 (two) times daily.   Yes [provider]  omeprazole (PRILOSEC) 20 MG capsule Take 20 mg by mouth daily.   Yes [provider]  polyethylene glycol (MIRALAX / GLYCOLAX) packet Take 17 g by mouth daily.   Yes [provider]  risperiDONE (RISPERDAL) 3 MG tablet Take 3 mg by mouth 2 (two) times daily.   Yes [provider]  topiramate (TOPAMAX) 50 MG tablet Take 50 mg by mouth 2 (two) times daily.   Yes [provider]  vitamin C (ASCORBIC ACID) 500 MG tablet Take 500 mg by mouth 2 (two) times daily.   Yes [provider]  amoxicillin-clavulanate (AUGMENTIN)  875-125 MG tablet Take 1 tablet by mouth every 12 (twelve) hours for 7 days. 07/15/18 07/22/18  Shaune PollackIsaacs, Biridiana Twardowski, MD  cephALEXin (KEFLEX) 500 MG capsule Take 1 capsule (500 mg total) by mouth 2 (two) times daily. Patient not taking: Reported on 07/15/2018 03/14/16   Loren RacerYelverton, David, MD  doxycycline (VIBRAMYCIN) 100 MG capsule Take 1 capsule (100 mg total) by mouth 2 (two) times daily for 7 days. 07/15/18 07/22/18  Shaune PollackIsaacs, Janea Schwenn, MD  HYDROcodone-acetaminophen (NORCO/VICODIN) 5-325 MG tablet Take 1-2 tablets by mouth every 6 (six) hours as needed for moderate pain. Patient not taking: Reported on 07/15/2018 05/17/15   Esperanza SheetsBuriev, Ulugbek N, MD  LORazepam (ATIVAN) 1 MG tablet Take 1 tablet (1 mg total) by mouth 3 (three) times daily as needed for anxiety. Patient not taking: Reported on 07/15/2018 12/26/15   Dione BoozeGlick, David, MD  naproxen (NAPROSYN) 375 MG tablet Take 1 tablet (375 mg total) by mouth 2 (two) times daily with a meal. Patient not taking: Reported on 07/15/2018 12/08/17   Palumbo, April, MD  nitrofurantoin, macrocrystal-monohydrate, (MACROBID) 100 MG capsule Take 1 capsule (100 mg total) by mouth 2 (two) times daily. X 7 days Patient not taking: Reported on 07/15/2018 12/26/15   Dione BoozeGlick, David, MD  predniSONE (DELTASONE) 20 MG tablet Take 2 tablets (40 mg total) by mouth daily for 5 days. 07/15/18 07/20/18  Shaune PollackIsaacs, Aleida Crandell, MD    Family History No family history on file.  Social History Social History   Tobacco Use  . Smoking status: Never Smoker  . Smokeless tobacco: Never Used  Substance Use Topics  . Alcohol use: No  . Drug use: No     Allergies   Patient has no known allergies.   Review of Systems Review of Systems  Constitutional: Positive for fatigue. Negative for chills and fever.  HENT: Negative for congestion and rhinorrhea.   Eyes: Negative for visual disturbance.  Respiratory: Positive for cough, shortness of breath and wheezing.   Cardiovascular: Negative for chest pain and leg swelling.   Gastrointestinal: Negative for abdominal pain, diarrhea, nausea and vomiting.  Genitourinary: Negative for dysuria and flank pain.  Musculoskeletal: Negative for neck pain and neck stiffness.  Skin: Negative for rash and wound.  Allergic/Immunologic: Negative for immunocompromised state.  Neurological: Negative for syncope, weakness and headaches.  All other systems reviewed and are negative.    Physical Exam Updated Vital Signs BP (!) 140/92   Pulse 93   Temp 98.6 F (37 C) (Oral)   Resp 20   Ht 5\' 2"  (1.575 m)   Wt 85.7 kg   SpO2 100%   BMI 34.57 kg/m   Physical Exam Vitals signs and nursing note reviewed.  Constitutional:      General: She is  not in acute distress.    Appearance: She is well-developed.  HENT:     Head: Normocephalic and atraumatic.  Eyes:     Conjunctiva/sclera: Conjunctivae normal.  Neck:     Musculoskeletal: Neck supple.  Cardiovascular:     Rate and Rhythm: Normal rate and regular rhythm.     Heart sounds: Normal heart sounds. No murmur. No friction rub.  Pulmonary:     Effort: Pulmonary effort is normal. No respiratory distress.     Breath sounds: Wheezing (scant, occasional) present. No rales.  Abdominal:     General: There is no distension.     Palpations: Abdomen is soft.     Tenderness: There is no abdominal tenderness.  Skin:    General: Skin is warm.     Capillary Refill: Capillary refill takes less than 2 seconds.  Neurological:     Mental Status: She is alert and oriented to person, place, and time.     Motor: No abnormal muscle tone.      ED Treatments / Results  Labs (all labs ordered are listed, but only abnormal results are displayed) Labs Reviewed  I-STAT CHEM 8, ED - Abnormal; Notable for the following components:      Result Value   Glucose, Bld 108 (*)    Calcium, Ion 1.14 (*)    Hemoglobin 11.2 (*)    HCT 33.0 (*)    All other components within normal limits  CULTURE, BLOOD (ROUTINE X 2)  CULTURE, BLOOD  (ROUTINE X 2)    EKG None  Radiology Dg Chest 2 View  Result Date: 07/15/2018 CLINICAL DATA:  Cough for 1 week. EXAM: CHEST - 2 VIEW COMPARISON:  12/08/2017 FINDINGS: The cardiomediastinal contours are normal. Subsegmental atelectasis at the left lung base. Chronic elevation of right hemidiaphragm. Pulmonary vasculature is normal. No consolidation, pleural effusion, or pneumothorax. No acute osseous abnormalities are seen. Chronic changes of both shoulders, worse on the right. IMPRESSION: Subsegmental left basilar atelectasis. Electronically Signed   By: Narda RutherfordMelanie  Sanford M.D.   On: 07/15/2018 01:06    Procedures Procedures (including critical care time)  Medications Ordered in ED Medications  albuterol (PROVENTIL HFA;VENTOLIN HFA) 108 (90 Base) MCG/ACT inhaler 2 puff (has no administration in time range)  ipratropium-albuterol (DUONEB) 0.5-2.5 (3) MG/3ML nebulizer solution 3 mL (3 mLs Nebulization Given 07/15/18 0326)  sodium chloride 0.9 % bolus 1,000 mL (0 mLs Intravenous Stopped 07/15/18 0637)  cefTRIAXone (ROCEPHIN) 2 g in sodium chloride 0.9 % 100 mL IVPB (0 g Intravenous Stopped 07/15/18 0409)  azithromycin (ZITHROMAX) 500 mg in sodium chloride 0.9 % 250 mL IVPB (0 mg Intravenous Stopped 07/15/18 0506)  predniSONE (DELTASONE) tablet 60 mg (60 mg Oral Given 07/15/18 0404)     Initial Impression / Assessment and Plan / ED Course  I have reviewed the triage vital signs and the nursing notes.  Pertinent labs & imaging results that were available during my care of the patient were reviewed by me and considered in my medical decision making (see chart for details).     78 yo F here with cough for 1 week w/ mild sputum production. Imaging shows mild basilar atelectasis. No fever, no hypoxia, no tachycardia or signs of respiratory distress. Pt does have h/o recurrent bronchitis and I suspect this is etiology for her sx, and she responded very well to nebs in the ED. Will start on nebs, prednisone  for component of RAD, and empiric ABX given atelectasis on CXR. Do not suspect this  is PNA at this time but given her age, will err on the side of caution. No signs of sepsis. She is wheelchair bound but satting wll in NAD on exam. D/c home.  Final Clinical Impressions(s) / ED Diagnoses   Final diagnoses:  Viral URI with cough    ED Discharge Orders         Ordered    predniSONE (DELTASONE) 20 MG tablet  Daily     07/15/18 0524    doxycycline (VIBRAMYCIN) 100 MG capsule  2 times daily     07/15/18 0524    amoxicillin-clavulanate (AUGMENTIN) 875-125 MG tablet  Every 12 hours     07/15/18 0524           Shaune Pollack, MD 07/15/18 [phone]

## 2018-07-15 NOTE — ED Notes (Signed)
Attempted to ambulate pt but pt stated she uses wheel chair to get around at home. Pt was able to take a few steps with the help of this NT and Sarah, RN but max support. Erma Heritage, MD made aware. Pt's oxygen saturations 99% while at rest, with lowest saturation while ambulating at 96%.

## 2018-07-15 NOTE — ED Notes (Signed)
Attempted PIV insertion. No sites found. Consult with charge RN for 2nd attempt.

## 2018-07-15 NOTE — ED Notes (Signed)
[facility] called for transportation  

## 2018-07-15 NOTE — ED Notes (Signed)
Pt provided with warm blanket. 

## 2018-07-15 NOTE — ED Notes (Signed)
Xray tech at bedside to take pt for CXR.

## 2020-06-20 ENCOUNTER — Other Ambulatory Visit: Payer: Self-pay

## 2020-06-20 ENCOUNTER — Emergency Department (HOSPITAL_COMMUNITY)
Admission: EM | Admit: 2020-06-20 | Discharge: 2020-06-21 | Disposition: A | Discharge status: Home / Self Care | Payer: Medicare Other | Attending: Emergency Medicine | Admitting: Emergency Medicine

## 2020-06-20 ENCOUNTER — Encounter (HOSPITAL_COMMUNITY): Payer: Self-pay

## 2020-06-20 DIAGNOSIS — Z7984 Long term (current) use of oral hypoglycemic drugs: Secondary | ICD-10-CM | POA: Diagnosis not present

## 2020-06-20 DIAGNOSIS — I10 Essential (primary) hypertension: Secondary | ICD-10-CM | POA: Insufficient documentation

## 2020-06-20 DIAGNOSIS — J45909 Unspecified asthma, uncomplicated: Secondary | ICD-10-CM | POA: Diagnosis not present

## 2020-06-20 DIAGNOSIS — K6289 Other specified diseases of anus and rectum: Secondary | ICD-10-CM | POA: Diagnosis not present

## 2020-06-20 DIAGNOSIS — Z7982 Long term (current) use of aspirin: Secondary | ICD-10-CM | POA: Insufficient documentation

## 2020-06-20 DIAGNOSIS — Z79899 Other long term (current) drug therapy: Secondary | ICD-10-CM | POA: Insufficient documentation

## 2020-06-20 DIAGNOSIS — E119 Type 2 diabetes mellitus without complications: Secondary | ICD-10-CM | POA: Insufficient documentation

## 2020-06-20 LAB — COMPREHENSIVE METABOLIC PANEL
ALT: 14 U/L (ref 0–44)
AST: 15 U/L (ref 15–41)
Albumin: 4 g/dL (ref 3.5–5.0)
Alkaline Phosphatase: 82 U/L (ref 38–126)
Anion gap: 12 (ref 5–15)
BUN: 9 mg/dL (ref 8–23)
CO2: 19 mmol/L — ABNORMAL LOW (ref 22–32)
Calcium: 9.1 mg/dL (ref 8.9–10.3)
Chloride: 99 mmol/L (ref 98–111)
Creatinine, Ser: 0.92 mg/dL (ref 0.44–1.00)
GFR, Estimated: >60 mL/min (ref >60)
Glucose, Bld: 116 mg/dL — ABNORMAL HIGH (ref 70–99)
Potassium: 4.3 mmol/L (ref 3.5–5.1)
Sodium: 130 mmol/L — ABNORMAL LOW (ref 135–145)
Total Bilirubin: 0.4 mg/dL (ref 0.3–1.2)
Total Protein: 7.7 g/dL (ref 6.5–8.1)

## 2020-06-20 LAB — CBC
HCT: 35.9 % — ABNORMAL LOW (ref 36.0–46.0)
Hemoglobin: 11.2 g/dL — ABNORMAL LOW (ref 12.0–15.0)
MCH: 23 pg — ABNORMAL LOW (ref 26.0–34.0)
MCHC: 31.2 g/dL (ref 30.0–36.0)
MCV: 73.9 fL — ABNORMAL LOW (ref 80.0–100.0)
Platelets: 301 10*3/uL (ref 150–400)
RBC: 4.86 MIL/uL (ref 3.87–5.11)
RDW: 15.2 % (ref 11.5–15.5)
WBC: 5.1 10*3/uL (ref 4.0–10.5)
nRBC: 0 % (ref 0.0–0.2)

## 2020-06-20 MED ORDER — PHENYLEPHRINE IN HARD FAT 0.25 % RE SUPP: 1.0000 | Freq: Two times a day (BID) | RECTAL | 0 refills | Status: AC

## 2020-06-20 NOTE — ED Notes (Signed)
[name], Med tech, has been notified and discharge instructions provided.

## 2020-06-20 NOTE — ED Provider Notes (Signed)
[facility]-EMERGENCY DEPT Provider Note   CSN: [id number] Arrival date & time: 06/20/20  1516     History No chief complaint on file.   [name] is a 79 y.o. female.  HPI Patient presents from nursing facility with concern for rectal pain. Patient is a poor historian does have a history of schizophrenia, but does seem to answer questions regarding her current situation appropriately, though briefly. Seemingly the patient always has some discomfort after bowel movements, but now over the past day she has had worsening pain.  Instructions that her pain is better currently, than it was earlier after her most recent bowel movement. No abdominal pain, no fever, no vomiting, no diarrhea. Is unclear if she takes any medication for relief, though seemingly the pain improves with passage of time following bowel movements.  Nursing home notes are not forthcoming about rationale for transfer other than stating the pain is worse today than typical.     Past Medical History:  Diagnosis Date  . Anemia   . Arthritis   . Asthma   . Cervical stenosis of spine   . Diabetes mellitus   . Hypertension   . Poor historian   . Rectal bleeding   . Schizophrenia, schizo-affective (HCC)   . Shortness of breath on exertion   . Tachycardia     Patient Active Problem List   Diagnosis Date Noted  . Dislocation of left hip (HCC) 05/16/2015  . Hip dysplasia, acquired 05/16/2015  . Dislocation of hip (HCC)   . HTN (hypertension) 03/17/2012  . GERD (gastroesophageal reflux disease) 03/17/2012  . Dementia (HCC) 03/17/2012  . Essential hypertension 03/14/2009  . Schizophrenia (HCC) 11/02/2008  . DIZZINESS 11/02/2008  . Headache(784.0) 11/02/2008    Past Surgical History:  Procedure Laterality Date  . CESAREAN SECTION     x4     OB History   No obstetric history on file.     History reviewed. No pertinent family history.  Social History   Tobacco Use  .  Smoking status: Never Smoker  . Smokeless tobacco: Never Used  Vaping Use  . Vaping Use: Never used  Substance Use Topics  . Alcohol use: No  . Drug use: No    Home Medications Prior to Admission medications   Medication Sig Start Date End Date Taking? Authorizing Provider  acetaminophen (TYLENOL) 500 MG tablet Take 1,000 mg by mouth 2 (two) times daily. Patient takes for arthritis pain.    [provider]  ALPRAZolam Prudy Feeler) 0.5 MG tablet Take 0.5 mg by mouth daily as needed for anxiety. For agitation/anxiety    [provider]  aspirin 81 MG chewable tablet Chew 81 mg by mouth daily.    [provider]  atorvastatin (LIPITOR) 10 MG tablet Take 5 mg by mouth daily.    [provider]  benztropine (COGENTIN) 1 MG tablet Take 1 mg by mouth 2 (two) times daily.    [provider]  cephALEXin (KEFLEX) 500 MG capsule Take 1 capsule (500 mg total) by mouth 2 (two) times daily. Patient not taking: Reported on 07/15/2018 03/14/16   Loren Racer, MD  donepezil (ARICEPT) 10 MG tablet Take 10 mg by mouth at bedtime.    [provider]  escitalopram (LEXAPRO) 10 MG tablet Take 10 mg by mouth daily.    [provider]  ferrous sulfate 325 (65 FE) MG tablet Take 325 mg by mouth 2 (two) times daily. anemia    [provider]  guaifenesin (ROBITUSSIN) 100 MG/5ML syrup Take 300 mg by mouth every 6 (six) hours as needed for cough.    [provider]  HYDROcodone-acetaminophen (NORCO/VICODIN) 5-325 MG tablet Take 1-2 tablets by mouth every 6 (six) hours as needed for moderate pain. Patient not taking: Reported on 07/15/2018 05/17/15   Esperanza Sheets, MD  lisinopril (PRINIVIL,ZESTRIL) 5 MG tablet Take 5 mg by mouth daily.    [provider]  LORazepam (ATIVAN) 1 MG tablet Take 1 tablet (1 mg total) by mouth 3 (three) times daily as needed for anxiety. Patient not taking: Reported on 07/15/2018 12/26/15   Dione Booze, MD   metFORMIN (GLUCOPHAGE) 500 MG tablet Take 500 mg by mouth 2 (two) times daily with a meal.    [provider]  metoprolol tartrate (LOPRESSOR) 25 MG tablet Take 25 mg by mouth 2 (two) times daily.    [provider]  naproxen (NAPROSYN) 375 MG tablet Take 1 tablet (375 mg total) by mouth 2 (two) times daily with a meal. Patient not taking: Reported on 07/15/2018 12/08/17   Palumbo, April, MD  nitrofurantoin, macrocrystal-monohydrate, (MACROBID) 100 MG capsule Take 1 capsule (100 mg total) by mouth 2 (two) times daily. X 7 days Patient not taking: Reported on 07/15/2018 12/26/15   Dione Booze, MD  omeprazole (PRILOSEC) 20 MG capsule Take 20 mg by mouth daily.    [provider]  polyethylene glycol (MIRALAX / GLYCOLAX) packet Take 17 g by mouth daily.    [provider]  risperiDONE (RISPERDAL) 3 MG tablet Take 3 mg by mouth 2 (two) times daily.    [provider]  topiramate (TOPAMAX) 50 MG tablet Take 50 mg by mouth 2 (two) times daily.    [provider]  vitamin C (ASCORBIC ACID) 500 MG tablet Take 500 mg by mouth 2 (two) times daily.    [provider]    Allergies    Patient has no known allergies.  Review of Systems   Review of Systems  Constitutional:       Per HPI, otherwise negative  HENT:       Per HPI, otherwise negative  Respiratory:       Per HPI, otherwise negative  Cardiovascular:       Per HPI, otherwise negative  Gastrointestinal: Positive for rectal pain. Negative for abdominal pain and vomiting.  Endocrine:       Negative aside from HPI  Genitourinary:       Neg aside from HPI   Musculoskeletal:       Per HPI, otherwise negative  Skin: Negative.   Neurological: Negative for syncope.    Physical Exam Updated Vital Signs BP 122/70 (BP Location: Left Arm)   Pulse (!) 108   Temp 98.8 F (37.1 C) (Oral)   Resp 18   Ht 5\' 2"  (1.575 m)   Wt 75.8 kg   SpO2 100%   BMI 30.54 kg/m   Physical  Exam Vitals and nursing note reviewed. Exam conducted with a chaperone present.  Constitutional:      General: She is not in acute distress.    Appearance: She is well-developed and well-nourished.  HENT:     Head: Normocephalic and atraumatic.  Eyes:     Extraocular Movements: EOM normal.     Conjunctiva/sclera: Conjunctivae normal.  Cardiovascular:     Rate and Rhythm: Normal rate and regular rhythm.  Pulmonary:     Effort: Pulmonary effort is normal. No respiratory distress.  Breath sounds: Normal breath sounds. No stridor.  Abdominal:     General: There is no distension.     Tenderness: There is no abdominal tenderness. There is no guarding.     Comments: No guarding, rebound or tenderness anywhere in the abdominal exam.  Genitourinary:   Musculoskeletal:        General: No edema.  Skin:    General: Skin is warm and dry.  Neurological:     Mental Status: She is alert and oriented to person, place, and time.     Cranial Nerves: No cranial nerve deficit.  Psychiatric:        Mood and Affect: Mood and affect normal.     Comments: Withdrawn slightly repetitive, but otherwise unremarkable     ED Results / Procedures / Treatments   Labs (all labs ordered are listed, but only abnormal results are displayed) Labs Reviewed  COMPREHENSIVE METABOLIC PANEL - Abnormal; Notable for the following components:      Result Value   Sodium 130 (*)    CO2 19 (*)    Glucose, Bld 116 (*)    All other components within normal limits  CBC - Abnormal; Notable for the following components:   Hemoglobin 11.2 (*)    HCT 35.9 (*)    MCV 73.9 (*)    MCH 23.0 (*)    All other components within normal limits  URINALYSIS, ROUTINE W REFLEX MICROSCOPIC    Procedures Procedures (including critical care time)  Medications Ordered in ED Medications - No data to display  ED Course  I have reviewed the triage vital signs and the nursing notes.  Pertinent labs & imaging results that were  available during my care of the patient were reviewed by me and considered in my medical decision making (see chart for details).  Adult female presents from nursing facility with concern for acute on chronic rectal pain.  Here she is awake, alert, afebrile, minimal tachycardia in triage improves with rest.  No substantial lab abnormalities, hemoglobin is chronically slightly anemic. Soft, nonperitoneal abdomen, low suspicion for acute new intra-abdominal processes per   Suspicion for fissure versus hemorrhoid contributing to her defecation associated rectal pain.  6:56 PM Patient in no distress, sitting up, no new complaints.  Patient started on appropriate medication, will follow up with GI as an outpatient.  Final Clinical Impression(s) / ED Diagnoses Final diagnoses:  Rectal pain   MDM Number of Diagnoses or Management Options Rectal pain: new, needed workup   Amount and/or Complexity of Data Reviewed Clinical lab tests: reviewed Tests in the medicine section of CPT: reviewed Decide to obtain previous medical records or to obtain history from someone other than the patient: yes Obtain history from someone other than the patient: yes Review and summarize past medical records: yes  Risk of Complications, Morbidity, and/or Mortality Presenting problems: high Diagnostic procedures: high Management options: high  Critical Care Total time providing critical care: < 30 minutes  Patient Progress Patient progress: stable  Rx / DC Orders ED Discharge Orders         Ordered    phenylephrine (,USE FOR PREPARATION-H,) 0.25 % suppository  2 times daily        06/20/20 1858           Gerhard Munch, MD 06/20/20 1858

## 2020-06-20 NOTE — ED Triage Notes (Signed)
Per EMS- patient is from [facility]. Patient c/o rectal pain and states it is chronic, but worse today. Patient states states she has rectal pain after every BM.

## 2020-06-20 NOTE — Discharge Instructions (Signed)
As discussed, today's evaluation has been generally reassuring. Your rectal pain may be due to a small fissure, or hemorrhoid.  There is no laboratory evidence for other acute phenomena.  However, if you do not improve in spite of using the prescribed medication, follow-up with your physician in 1 week.

## 2020-06-20 NOTE — ED Notes (Signed)
[facility] called  

## 2022-08-12 ENCOUNTER — Other Ambulatory Visit (HOSPITAL_COMMUNITY): Payer: Self-pay

## 2024-02-11 DEATH — deceased
